# Patient Record
Sex: Female | Born: 1970 | Race: White | Hispanic: No | Marital: Married | Smoking: Never smoker
Health system: Southern US, Community
[De-identification: ages and names within clinical notes are randomized; demographics above are authoritative.]

## PROBLEM LIST (undated history)

## (undated) DIAGNOSIS — N200 Calculus of kidney: Secondary | ICD-10-CM

## (undated) HISTORY — PX: DILATION AND CURETTAGE OF UTERUS: SHX78

## (undated) HISTORY — DX: Calculus of kidney: N20.0

---

## 1999-11-08 ENCOUNTER — Inpatient Hospital Stay (HOSPITAL_COMMUNITY): Admission: AD | Admit: 1999-11-08 | Discharge: 1999-11-10 | Payer: Self-pay | Admitting: Gynecology

## 1999-12-20 ENCOUNTER — Other Ambulatory Visit: Admission: RE | Admit: 1999-12-20 | Discharge: 1999-12-20 | Payer: Self-pay | Admitting: Gynecology

## 2002-04-30 ENCOUNTER — Other Ambulatory Visit: Admission: RE | Admit: 2002-04-30 | Discharge: 2002-04-30 | Payer: Self-pay | Admitting: Gynecology

## 2002-07-31 ENCOUNTER — Inpatient Hospital Stay (HOSPITAL_COMMUNITY): Admission: AD | Admit: 2002-07-31 | Discharge: 2002-07-31 | Payer: Self-pay | Admitting: Obstetrics and Gynecology

## 2002-11-23 ENCOUNTER — Inpatient Hospital Stay (HOSPITAL_COMMUNITY): Admission: AD | Admit: 2002-11-23 | Discharge: 2002-11-25 | Payer: Self-pay | Admitting: Gynecology

## 2002-11-23 ENCOUNTER — Encounter (INDEPENDENT_AMBULATORY_CARE_PROVIDER_SITE_OTHER): Payer: Self-pay | Admitting: Specialist

## 2002-11-23 ENCOUNTER — Inpatient Hospital Stay (HOSPITAL_COMMUNITY): Admission: AD | Admit: 2002-11-23 | Discharge: 2002-11-23 | Payer: Self-pay | Admitting: Gynecology

## 2002-12-30 ENCOUNTER — Other Ambulatory Visit: Admission: RE | Admit: 2002-12-30 | Discharge: 2002-12-30 | Payer: Self-pay | Admitting: Gynecology

## 2006-10-04 ENCOUNTER — Ambulatory Visit: Payer: Self-pay

## 2006-10-04 ENCOUNTER — Observation Stay: Payer: Self-pay

## 2006-10-10 ENCOUNTER — Ambulatory Visit: Payer: Self-pay

## 2007-05-30 ENCOUNTER — Inpatient Hospital Stay (HOSPITAL_COMMUNITY): Admission: AD | Admit: 2007-05-30 | Discharge: 2007-05-30 | Payer: Self-pay | Admitting: Obstetrics and Gynecology

## 2007-10-26 ENCOUNTER — Encounter: Admission: RE | Admit: 2007-10-26 | Discharge: 2007-10-26 | Payer: Self-pay | Admitting: Obstetrics and Gynecology

## 2007-12-19 HISTORY — PX: TUBAL LIGATION: SHX77

## 2008-01-01 ENCOUNTER — Inpatient Hospital Stay (HOSPITAL_COMMUNITY): Admission: AD | Admit: 2008-01-01 | Discharge: 2008-01-04 | Payer: Self-pay | Admitting: Obstetrics and Gynecology

## 2008-08-19 HISTORY — PX: OTHER SURGICAL HISTORY: SHX169

## 2011-02-01 NOTE — Op Note (Signed)
[name], Natasha Hartman              ACCOUNT NO.:  [id number]   MEDICAL RECORD NO.:  [id number]           PATIENT TYPE:   LOCATION:                                 FACILITY:   PHYSICIAN:  Duke Salvia. Marcelle Overlie, M.Natasha.DATE OF BIRTH:  1971/08/16   DATE OF PROCEDURE:  01/02/2008  DATE OF DISCHARGE:                               OPERATIVE REPORT   PREOPERATIVE DIAGNOSIS:  Requests permanent sterilization.   POSTOPERATIVE DIAGNOSIS:  Requests permanent sterilization.   PROCEDURE:  Postpartum tubal ligation by Filshie clip application.   SURGEON:  Duke Salvia. Marcelle Overlie, M.Natasha.   ANESTHESIA:  Epidural.   COMPLICATIONS:  None.   DRAINS:  Foley catheter.   BLOOD LOSS:  Minimal.   INDICATIONS:  A 40 year old G3, now P3 status post vaginal delivery  presents for permanent sterilization.  This procedure including risks  related to bleeding, infection, other complications that may require  additional surgery, the permanence of the procedure and failure rate of  2-3 per 1000 all reviewed with her.  She remains firm in her decision.   PROCEDURE AND FINDINGS:  The patient was taken to the operating room.  After an adequate level of epidural anesthesia was obtained, the  periumbilical area was prepped and draped.  A small subumbilical  incision was made, carried down individually through fascia and  peritoneum, [employer] retractors were positioned and used to identify  the right tube which was grasped with a Babcock clamp, traced to the  fimbriated end.  The tube was then grasped 3-4 cm from the cornu between  2 Babcock clamps and a Filshie clip applied at a right angle completely  engulfing the tube with excellent application.  This area was  hemostatic.  On the left side the tube was traced all the way to the  fimbriated end, was regrasped 3-4 cm from the cornu between 2 Babcock  clamps and a Filshie clip was applied at right angles again, with  excellent application.  Prior to closure, sponge,  needle and instrument  counts were reported as correct x2.  The fascia was closed with a  running 2-0 Vicryl suture.  A 4-0 Vicryl subcuticular used on the skin  and sterile dressing applied.  She went to recovery room in good  condition.      Richard M. Marcelle Overlie, M.Natasha.  Electronically Signed    RMH/MEDQ  Natasha:  01/02/2008  T:  01/02/2008  Job:  [id number]

## 2011-02-04 NOTE — Discharge Summary (Signed)
   NAME:  Natasha Hartman, Natasha Hartman                     ACCOUNT NO.:  [id number]   MEDICAL RECORD NO.:  [id number]                   PATIENT TYPE:  INP   LOCATION:  9121                                 FACILITY:  [facility]   PHYSICIAN:  Juan H. Lily Peer, M.D.             DATE OF BIRTH:  1971-01-31   DATE OF ADMISSION:  11/23/2002  DATE OF DISCHARGE:  11/25/2002                                 DISCHARGE SUMMARY   DISCHARGE DIAGNOSES:  1. Intrauterine pregnancy 39 weeks, delivered.  2. Rh negative.  3. Vacuum assisted vaginal delivery.   HISTORY:  This is a 40 year old female gravida 2, para 1 with an EDC of  11/25/02.  Prenatal course was uncomplicated other than having an early  placenta previa which resolved.  She also was Rh negative and received  RhoGAM at 28 weeks.   HOSPITAL COURSE:  On 11/23/02 patient presented at 39+ weeks in labor.  Labor  was augmented with Pitocin. Due to the fact there was minimal fluid she had  artificial rupture of membranes.  The patient was started on amnioinfusion.  It was noted there were failed decelerations therefore patient underwent a  vacuum assisted vaginal delivery of a female, Apgar's of 8 and 9, weight of 7  pounds and one ounce on 11/23/02.  There was a midline episiotomy which was  repaired.  There were two small labial tears superficially which did not  require suturing.  Noted to be tight nuchal cord which was surgically  reduced.  It was noted that the female infant had evidence of hypospadias.  Postpartum the patient remained afebrile and was in stable condition.  She  was discharged home on 11/25/02 and given precautions and instructions,  postpartum booklet.   LABORATORY DATA:  The patient is O negative.  Rubella immune.  On 11/24/02  hemoglobin was 13.4.   DISPOSITION:  The patient is discharged home. Follow up in the office in six  weeks.  To be seen in the office for any prior problems.  She was given  RhoGAM on 11/24/02.     Susa Loffler,  P.A.                    Juan H. Lily Peer, M.D.    TSG/MEDQ  D:  01/17/2003  T:  01/17/2003  Job:  [id number]

## 2011-02-04 NOTE — H&P (Signed)
NAME:  Natasha Hartman, Natasha Hartman                     ACCOUNT NO.:  [id number]   MEDICAL RECORD NO.:  [id number]                   PATIENT TYPE:  INP   LOCATION:  9167                                 FACILITY:  [facility]   PHYSICIAN:  Juan H. Lily Peer, M.D.             DATE OF BIRTH:  26-Dec-1970   DATE OF ADMISSION:  11/23/2002  DATE OF DISCHARGE:                                HISTORY & PHYSICAL   CHIEF COMPLAINT:  Contractions.   HISTORY OF PRESENT ILLNESS:  The patient is a 40 year old gravida 2, para 1,  last menstrual period March 18, 2002, estimated date of confinement November 25, 2002.  Size and dates are in concurrence with early ultrasound.  The patient  started complaining of contractions at approximately 0430 hours this  morning. She presented to [facility] early this morning and was placed  on the monitor.  She had very mild infrequent contractions and her cervix  was 1 cm, 50%, and ballotable and after reassuring heart rate tracing with  stable vital signs, she was released home.  She later returned this  afternoon complaining of increasing frequency and intensity of her  contractions. She was found to be contracting every five minutes apart.  Her  cervix had changed to 4 cm, 50% effaced, and -3 station with reassuring  fetal heart rate tracing and stable vital signs.  She was admitted whereby  she underwent attempt at rupture of membranes and no fluid was obtained.  Scalp electrode and IUPC were placed.  Fetal heart rate tracing continued to  be reassuring and her GBS titers were negative.  She had benign prenatal  course.  She was treated for moniliasis during her pregnancy and early on  had a placenta previa that had resolved on follow-up ultrasound. She also  had received her RhoGAM at [redacted] weeks gestation.   PAST MEDICAL HISTORY:  She had a normal spontaneous vaginal delivery at [redacted]  weeks gestation in the year 2001 of a 7 pound 13 ounce female.  She  otherwise has a benign  past medical history.   REVIEW OF SYSTEMS:  See [place] form.   PHYSICAL EXAMINATION:  VITAL SIGNS: Blood pressure 130/77, pulse 89,  respirations 20, temperature 98.1.  HEENT:  Unremarkable.  NECK:  Supple. Trachea midline.  No carotid bruits. No thyromegaly.  LUNGS:  Clear to auscultation without rhonchi or wheezes.  HEART:  Regular rate and rhythm without murmurs, rubs, or gallops.  BREASTS: Not done.  ABDOMEN:  Gravid uterus. Vertex presentation by [facility] maneuver.  Positive  fetal heart tones.  PELVIC:  Cervix 4 cm dilated, 50% effaced, and -3 station.  EXTREMITIES:  DTR 1+, negative clonus, trace edema.   PRENATAL LABORATORY DATA:  O negative blood, negative antibody screen, VDRL  was nonreactive, rubella immune. Hepatitis B surface antigen and HIV were  negative.  Maternal serum alpha fetoprotein was normal.  Diabetes screen was  normal.  GBS cultures  were negative.   ASSESSMENT:  A 40 year old gravida 2, para 1, at 39-1/[redacted] weeks gestation is  admitted in labor and advanced cervical dilatation.  On monitoring in labor  and delivery, her contractions had spaced out. She will be augmented with  Pitocin at high dose per protocol and she wishes to have an epidural.  Reassuring fetal heart rate tracing, will  continue to monitor closely and hopefully anticipate a vaginal delivery.  As  noted her Group B Strep culture was negative.   PLAN:  As per assessment above.                                               Juan H. Lily Peer, M.D.    JHF/MEDQ  D:  11/23/2002  T:  11/23/2002  Job:  [id number]

## 2011-06-14 LAB — CBC
MCHC: 34.6
Platelets: 195
Platelets: 156
RBC: 4.16
RDW: 15.1
WBC: 11.4 — ABNORMAL HIGH

## 2011-06-14 LAB — RH IMMUNE GLOB WKUP(>/=20WKS)(NOT WOMEN'S HOSP): Fetal Screen: NEGATIVE

## 2011-06-14 LAB — RPR: RPR Ser Ql: NONREACTIVE

## 2011-07-01 LAB — RH IMMUNE GLOBULIN WORKUP (NOT WOMEN'S HOSP): Antibody Screen: NEGATIVE

## 2014-12-10 ENCOUNTER — Ambulatory Visit: Payer: Self-pay | Admitting: Family Medicine

## 2014-12-11 ENCOUNTER — Emergency Department: Payer: Self-pay | Admitting: Emergency Medicine

## 2014-12-11 LAB — COMPREHENSIVE METABOLIC PANEL
ALK PHOS: 75 U/L
Albumin: 3.7 g/dL
Anion Gap: 9 (ref 7–16)
BILIRUBIN TOTAL: 0.4 mg/dL
BUN: 28 mg/dL — AB
CO2: 25 mmol/L
Calcium, Total: 9 mg/dL
Chloride: 104 mmol/L
Creatinine: 1.78 mg/dL — ABNORMAL HIGH
EGFR (African American): 40 — ABNORMAL LOW
EGFR (Non-African Amer.): 35 — ABNORMAL LOW
Glucose: 107 mg/dL — ABNORMAL HIGH
Potassium: 3 mmol/L — ABNORMAL LOW
SGOT(AST): 28 U/L
SGPT (ALT): 19 U/L
Sodium: 138 mmol/L
TOTAL PROTEIN: 7 g/dL

## 2014-12-11 LAB — CBC WITH DIFFERENTIAL/PLATELET
BASOS ABS: 0.1 10*3/uL (ref 0.0–0.1)
Basophil %: 0.8 %
EOS PCT: 0.4 %
Eosinophil #: 0 10*3/uL (ref 0.0–0.7)
HCT: 39.8 % (ref 35.0–47.0)
HGB: 13.6 g/dL (ref 12.0–16.0)
LYMPHS ABS: 1.8 10*3/uL (ref 1.0–3.6)
Lymphocyte %: 17.7 %
MCH: 29.3 pg (ref 26.0–34.0)
MCHC: 34.1 g/dL (ref 32.0–36.0)
MCV: 86 fL (ref 80–100)
Monocyte #: 0.7 x10 3/mm (ref 0.2–0.9)
Monocyte %: 6.5 %
Neutrophil #: 7.5 10*3/uL — ABNORMAL HIGH (ref 1.4–6.5)
Neutrophil %: 74.6 %
Platelet: 263 10*3/uL (ref 150–440)
RBC: 4.63 10*6/uL (ref 3.80–5.20)
RDW: 12.6 % (ref 11.5–14.5)
WBC: 10 10*3/uL (ref 3.6–11.0)

## 2014-12-11 LAB — URINALYSIS, COMPLETE
Bilirubin,UR: NEGATIVE
Glucose,UR: NEGATIVE mg/dL (ref 0–75)
KETONE: NEGATIVE
LEUKOCYTE ESTERASE: NEGATIVE
NITRITE: NEGATIVE
PH: 7 (ref 4.5–8.0)
Protein: 30
RBC,UR: <1 /HPF (ref 0–5)
Specific Gravity: 1.004 (ref 1.003–1.030)

## 2015-04-13 ENCOUNTER — Other Ambulatory Visit: Payer: Self-pay | Admitting: Obstetrics and Gynecology

## 2015-04-14 LAB — CYTOLOGY - PAP

## 2015-07-21 ENCOUNTER — Telehealth: Payer: Self-pay

## 2015-07-21 NOTE — Telephone Encounter (Signed)
Patient called regarding blood in the stool. Patient was given an appointment with Dr. Sherrie MustacheFisher this Friday 07/24/15 @ 2:15.  I triaged the patient. Per patient onset started two weeks ago. The first week patient saw blood in the toilet (bright red blood). This week is not as much as last week  But is still there every time patient has a bowel movement and is loose. Per patient has been having abdominal pain. Sometimes is every time she eats something with loose stool. Now patient states sees the blood in her stool constantly.   Per Patient has a family history with Diverticulitis ans mother had polyps and were removed.  Please advise if patient needs to be seen sooner.  Thanks,  -Heidemarie Goodnow

## 2015-07-21 NOTE — Telephone Encounter (Signed)
OK to come in on Friday, but if she has any fever, heavy bleeding, or significant abdominal pain she needs to go to the ER.

## 2015-07-21 NOTE — Telephone Encounter (Signed)
[id number] 

## 2015-07-24 ENCOUNTER — Ambulatory Visit: Payer: Self-pay | Admitting: Family Medicine

## 2015-07-24 NOTE — Telephone Encounter (Signed)
Patient was notified. Patient had scheduled an appt for 07/24/15, however she canceled the appt because she is feeling better now. Patient stated that she is going to call back and schedule another appointment for a referral to gastro for the recurring symptoms.

## 2016-04-19 ENCOUNTER — Other Ambulatory Visit: Payer: Self-pay | Admitting: Obstetrics and Gynecology

## 2016-04-19 DIAGNOSIS — R928 Other abnormal and inconclusive findings on diagnostic imaging of breast: Secondary | ICD-10-CM

## 2016-04-20 ENCOUNTER — Ambulatory Visit
Admission: RE | Admit: 2016-04-20 | Discharge: 2016-04-20 | Disposition: A | Discharge status: Home / Self Care | Payer: Self-pay | Source: Ambulatory Visit | Attending: Obstetrics and Gynecology | Admitting: Obstetrics and Gynecology

## 2016-04-20 DIAGNOSIS — R928 Other abnormal and inconclusive findings on diagnostic imaging of breast: Secondary | ICD-10-CM

## 2017-05-03 DIAGNOSIS — Z01419 Encounter for gynecological examination (general) (routine) without abnormal findings: Secondary | ICD-10-CM | POA: Diagnosis not present

## 2017-05-03 DIAGNOSIS — Z1231 Encounter for screening mammogram for malignant neoplasm of breast: Secondary | ICD-10-CM | POA: Diagnosis not present

## 2017-07-23 DIAGNOSIS — Z23 Encounter for immunization: Secondary | ICD-10-CM | POA: Diagnosis not present

## 2017-12-22 ENCOUNTER — Ambulatory Visit: Payer: 59 | Admitting: Family Medicine

## 2017-12-22 ENCOUNTER — Encounter: Payer: Self-pay | Admitting: Family Medicine

## 2017-12-22 VITALS — BP 100/72 | HR 90 | Temp 97.9°F | Resp 16 | Ht 62.0 in | Wt 132.0 lb

## 2017-12-22 DIAGNOSIS — K219 Gastro-esophageal reflux disease without esophagitis: Secondary | ICD-10-CM | POA: Insufficient documentation

## 2017-12-22 DIAGNOSIS — E876 Hypokalemia: Secondary | ICD-10-CM | POA: Insufficient documentation

## 2017-12-22 DIAGNOSIS — J0100 Acute maxillary sinusitis, unspecified: Secondary | ICD-10-CM | POA: Diagnosis not present

## 2017-12-22 DIAGNOSIS — Z8632 Personal history of gestational diabetes: Secondary | ICD-10-CM | POA: Insufficient documentation

## 2017-12-22 DIAGNOSIS — R42 Dizziness and giddiness: Secondary | ICD-10-CM | POA: Diagnosis not present

## 2017-12-22 DIAGNOSIS — R112 Nausea with vomiting, unspecified: Secondary | ICD-10-CM

## 2017-12-22 LAB — POCT URINALYSIS DIPSTICK
Glucose, UA: NEGATIVE
LEUKOCYTES UA: NEGATIVE
NITRITE UA: NEGATIVE
PH UA: 6 (ref 5.0–8.0)
UROBILINOGEN UA: 0.2 U/dL

## 2017-12-22 MED ORDER — MECLIZINE HCL 25 MG PO TABS: 25.0000 mg | ORAL_TABLET | Freq: Three times a day (TID) | ORAL | 1 refills | Status: DC | PRN

## 2017-12-22 MED ORDER — AMOXICILLIN 500 MG PO CAPS: 1000.0000 mg | ORAL_CAPSULE | Freq: Two times a day (BID) | ORAL | 0 refills | Status: AC

## 2017-12-22 MED ORDER — FLUCONAZOLE 150 MG PO TABS: 150.0000 mg | ORAL_TABLET | Freq: Once | ORAL | 0 refills | Status: DC

## 2017-12-22 NOTE — Progress Notes (Signed)
Patient: Natasha Hartman Female    DOB: 05/11/1971   47 y.o.   MRN: [id number] Visit Date: 12/22/2017  Today's Provider: Mila Merryonald Fisher, MD   Chief Complaint  Patient presents with  . Headache   Subjective:    Headache   This is a new problem. The current episode started yesterday. The problem has been gradually worsening. The pain is located in the frontal and retro-orbital region. Quality: pressure. Associated symptoms include blurred vision, dizziness, ear pain (throbbing pain and pressure in ears), eye pain (behind the eyes), photophobia, sinus pressure, a sore throat and vomiting. Pertinent negatives include no abdominal pain, coughing, eye redness, eye watering, fever, nausea, swollen glands or weakness. The symptoms are aggravated by bright light and food. She has tried NSAIDs for the symptoms. The treatment provided mild relief.   She states it is similar to previous episodes of vertigo although nausea and vomiting have been worse. Has taking meclizine in the past but it makes her tired. She did have some pain in left lower back overnight which is better today.    Allergies  Allergen Reactions  . Codeine     GI upset     Current Outpatient Medications:  .  cetirizine (ZYRTEC) 10 MG tablet, Take 10 mg by mouth daily., Disp: , Rfl:  .  JUNEL FE 1/20 1-20 MG-MCG tablet, TAKE ACTIVE PILLS ONLY, NO WEEK OFF FOR MENSES, Disp: , Rfl: 4  Review of Systems  Constitutional: Positive for appetite change. Negative for chills, fatigue and fever.  HENT: Positive for ear pain (throbbing pain and pressure in ears), sinus pressure, sinus pain and sore throat.   Eyes: Positive for blurred vision, photophobia and pain (behind the eyes). Negative for redness.  Respiratory: Negative for cough, chest tightness and shortness of breath.   Cardiovascular: Negative for chest pain and palpitations.  Gastrointestinal: Positive for vomiting. Negative for abdominal pain and nausea.    Neurological: Positive for dizziness and headaches. Negative for weakness.  Psychiatric/Behavioral: Positive for sleep disturbance.    Social History   Tobacco Use  . Smoking status: Never Smoker  . Smokeless tobacco: Never Used  Substance Use Topics  . Alcohol use: Never    Frequency: Never   Objective:   BP 100/72 (BP Location: Left Arm, Patient Position: Sitting, Cuff Size: Normal)   Pulse 90   Temp 97.9 F (36.6 C) (Oral)   Resp 16   Ht 5\' 2"  (1.575 m)   Wt 132 lb (59.9 kg)   SpO2 97% Comment: room air  BMI 24.14 kg/m  Vitals:   12/22/17 1119  BP: 100/72  Pulse: 90  Resp: 16  Temp: 97.9 F (36.6 C)  TempSrc: Oral  SpO2: 97%  Weight: 132 lb (59.9 kg)  Height: 5\' 2"  (1.575 m)     Physical Exam  General Appearance:    Alert, cooperative, no distress  HENT:   bilateral TM normal without fluid or infection, neck without nodes, throat normal without erythema or exudate, sinuses nontender and nasal mucosa congested.   Eyes:    PERRL, conjunctiva/corneas clear, EOM's intact       Lungs:     Clear to auscultation bilaterally, respirations unlabored  Heart:    Regular rate and rhythm  Abd::   No CVAT      Results for orders placed or performed in visit on 12/22/17  POCT Urinalysis Dipstick  Result Value Ref Range   Color, UA amber  Clarity, UA cloudy    Glucose, UA negative    Bilirubin, UA color not on strip    Ketones, UA small (15)    Spec Grav, UA >=1.030 (A) 1.010 - 1.025   Blood, UA Large (Hemolyzed)    pH, UA 6.0 5.0 - 8.0   Protein, UA 30 (+)    Urobilinogen, UA 0.2 0.2 or 1.0 E.U./dL   Nitrite, UA Negative    Leukocytes, UA Negative Negative   Appearance     Odor         Assessment & Plan:     1. Vertigo  - meclizine (ANTIVERT) 25 MG tablet; Take 1 tablet (25 mg total) by mouth 3 (three) times daily as needed for dizziness.  Dispense: 30 tablet; Refill: 1  2. Nausea and vomiting, intractability of vomiting not specified, unspecified  vomiting type Is keeping down fluids. Expect to improve with meclizine.  - POCT Urinalysis Dipstick  3. Acute maxillary sinusitis, recurrence not specified  - amoxicillin (AMOXIL) 500 MG capsule; Take 2 capsules (1,000 mg total) by mouth 2 (two) times daily for 7 days.  Dispense: 28 capsule; Refill: 0 - fluconazole (DIFLUCAN) 150 MG tablet; Take 1 tablet (150 mg total) by mouth once for 1 dose.  Dispense: 1 tablet; Refill: 0  Call if symptoms change or if not rapidly improving.           Mila Merry, MD  [facility]

## 2017-12-22 NOTE — Patient Instructions (Signed)

## 2017-12-29 ENCOUNTER — Telehealth: Payer: Self-pay | Admitting: Family Medicine

## 2017-12-29 DIAGNOSIS — J0100 Acute maxillary sinusitis, unspecified: Secondary | ICD-10-CM

## 2017-12-29 MED ORDER — FLUCONAZOLE 150 MG PO TABS: 150.0000 mg | ORAL_TABLET | Freq: Once | ORAL | 0 refills | Status: AC

## 2017-12-29 NOTE — Telephone Encounter (Signed)
Please review. Thanks!  

## 2017-12-29 NOTE — Telephone Encounter (Signed)
Patient said she needed another Diflucan since taking Amoxicillin.   She has already taken 1 but that didn't cure the yeast.  [facility] [address]

## 2018-05-08 DIAGNOSIS — Z6824 Body mass index (BMI) 24.0-24.9, adult: Secondary | ICD-10-CM | POA: Diagnosis not present

## 2018-05-08 DIAGNOSIS — Z01419 Encounter for gynecological examination (general) (routine) without abnormal findings: Secondary | ICD-10-CM | POA: Diagnosis not present

## 2018-05-08 DIAGNOSIS — Z1231 Encounter for screening mammogram for malignant neoplasm of breast: Secondary | ICD-10-CM | POA: Diagnosis not present

## 2018-05-10 ENCOUNTER — Other Ambulatory Visit: Payer: Self-pay | Admitting: Obstetrics and Gynecology

## 2018-05-10 DIAGNOSIS — R928 Other abnormal and inconclusive findings on diagnostic imaging of breast: Secondary | ICD-10-CM

## 2018-05-14 ENCOUNTER — Ambulatory Visit
Admission: RE | Admit: 2018-05-14 | Discharge: 2018-05-14 | Disposition: A | Discharge status: Home / Self Care | Payer: 59 | Source: Ambulatory Visit | Attending: Obstetrics and Gynecology | Admitting: Obstetrics and Gynecology

## 2018-05-14 ENCOUNTER — Other Ambulatory Visit: Payer: Self-pay | Admitting: Obstetrics and Gynecology

## 2018-05-14 DIAGNOSIS — R921 Mammographic calcification found on diagnostic imaging of breast: Secondary | ICD-10-CM

## 2018-05-14 DIAGNOSIS — R928 Other abnormal and inconclusive findings on diagnostic imaging of breast: Secondary | ICD-10-CM

## 2018-05-15 ENCOUNTER — Ambulatory Visit
Admission: RE | Admit: 2018-05-15 | Discharge: 2018-05-15 | Disposition: A | Discharge status: Home / Self Care | Payer: 59 | Source: Ambulatory Visit | Attending: Obstetrics and Gynecology | Admitting: Obstetrics and Gynecology

## 2018-05-15 DIAGNOSIS — R921 Mammographic calcification found on diagnostic imaging of breast: Secondary | ICD-10-CM

## 2018-05-15 DIAGNOSIS — N6011 Diffuse cystic mastopathy of right breast: Secondary | ICD-10-CM | POA: Diagnosis not present

## 2018-06-27 DIAGNOSIS — Z23 Encounter for immunization: Secondary | ICD-10-CM | POA: Diagnosis not present

## 2018-11-15 ENCOUNTER — Encounter: Payer: Self-pay | Admitting: Family Medicine

## 2018-11-15 ENCOUNTER — Ambulatory Visit: Payer: 59 | Admitting: Family Medicine

## 2018-11-15 VITALS — BP 138/78 | HR 85 | Temp 99.1°F | Resp 18 | Wt 137.0 lb

## 2018-11-15 DIAGNOSIS — J069 Acute upper respiratory infection, unspecified: Secondary | ICD-10-CM

## 2018-11-15 DIAGNOSIS — R05 Cough: Secondary | ICD-10-CM | POA: Diagnosis not present

## 2018-11-15 DIAGNOSIS — R059 Cough, unspecified: Secondary | ICD-10-CM

## 2018-11-15 MED ORDER — BUDESONIDE-FORMOTEROL FUMARATE 160-4.5 MCG/ACT IN AERO: 2.0000 | INHALATION_SPRAY | Freq: Two times a day (BID) | RESPIRATORY_TRACT | 3 refills | Status: DC

## 2018-11-15 MED ORDER — AZITHROMYCIN 250 MG PO TABS: ORAL_TABLET | ORAL | 0 refills | Status: DC

## 2018-11-15 NOTE — Progress Notes (Signed)
       Patient: Natasha Hartman Female    DOB: 1970-12-31   48 y.o.   MRN: [id number] Visit Date: 11/15/2018  Today's Provider: Mila Merry, MD   Chief Complaint  Patient presents with  . Cough    X 1 week   Subjective:     Cough  This is a new problem. Episode onset: 1 week ago. The problem has been gradually worsening. The cough is non-productive. Associated symptoms include chest pain, myalgias, rhinorrhea and shortness of breath. Pertinent negatives include no chills, fever or sore throat. Treatments tried: Sudafed and Ibuprofen. The treatment provided mild relief.  Patient states her chest hurts when she coughs.  Allergies  Allergen Reactions  . Codeine     GI upset     Current Outpatient Medications:  .  cetirizine (ZYRTEC) 10 MG tablet, Take 10 mg by mouth daily., Disp: , Rfl:  .  JUNEL FE 1/20 1-20 MG-MCG tablet, TAKE ACTIVE PILLS ONLY, NO WEEK OFF FOR MENSES, Disp: , Rfl: 4  Review of Systems  Constitutional: Positive for fatigue. Negative for appetite change, chills, diaphoresis and fever.  HENT: Positive for congestion (chest congestion and nasal congestion) and rhinorrhea. Negative for sinus pressure, sinus pain, sneezing and sore throat.   Respiratory: Positive for cough and shortness of breath. Negative for chest tightness.   Cardiovascular: Positive for chest pain. Negative for palpitations.  Gastrointestinal: Negative for abdominal pain, nausea and vomiting.  Musculoskeletal: Positive for myalgias.  Neurological: Negative for dizziness and weakness.    Social History   Tobacco Use  . Smoking status: Never Smoker  . Smokeless tobacco: Never Used  Substance Use Topics  . Alcohol use: Never    Frequency: Never      Objective:   BP 138/78 (BP Location: Left Arm, Patient Position: Sitting, Cuff Size: Normal)   Pulse 85   Temp 99.1 F (37.3 C) (Oral)   Resp 18   Wt 137 lb (62.1 kg)   SpO2 97% Comment: room air  BMI 25.06 kg/m  Vitals:   11/15/18 0859  BP: 138/78  Pulse: 85  Resp: 18  Temp: 99.1 F (37.3 C)  TempSrc: Oral  SpO2: 97%  Weight: 137 lb (62.1 kg)     Physical Exam  General Appearance:    Alert, cooperative, no distress  HENT:   bilateral TM normal without fluid or infection, neck without nodes, pharynx erythematous without exudate and nasal mucosa pale and congested  Eyes:    PERRL, conjunctiva/corneas clear, EOM's intact       Lungs:     Occasional expiratory wheeze, no rales, , respirations unlabored  Heart:    Regular rate and rhythm  Neurologic:   Awake, alert, oriented x 3. No apparent focal neurological           defect.           Assessment & Plan    1. Upper respiratory tract infection, unspecified type  - azithromycin (ZITHROMAX) 250 MG tablet; Take one tablet twice today.  Then, take 1 tablet daily for 4 days.  Dispense: 6 tablet; Refill: 0  2. Cough  - budesonide-formoterol (SYMBICORT) 160-4.5 MCG/ACT inhaler; Inhale 2 puffs into the lungs 2 (two) times daily for 14 days.  Dispense: 1 Inhaler; Refill: 3  Call if symptoms change or if not rapidly improving.        Mila Merry, MD  [facility]

## 2018-11-15 NOTE — Patient Instructions (Signed)
Please review the attached list of medications and notify my office if there are any errors.   Please bring all of your medications to every appointment so we can make sure that our medication list is the same as yours.   You can take OTC Mucinex (guaifenesin) for chest or sinus congestion and to help your cough  

## 2018-11-25 DIAGNOSIS — J180 Bronchopneumonia, unspecified organism: Secondary | ICD-10-CM | POA: Diagnosis not present

## 2018-11-26 ENCOUNTER — Telehealth: Payer: Self-pay | Admitting: Family Medicine

## 2018-11-26 NOTE — Telephone Encounter (Signed)
Side and chest pain is probably pleurisy, which is inflammation around the lining of lung, usually from coughing. If this is improving then not prescription needed. If not then can send in prescription for 6 days prednisone taper.

## 2018-11-26 NOTE — Telephone Encounter (Signed)
Dr. [name], would you like me to call the patient? She was seen in the office 11/15/2018 for URI and cough. Patient was treated with z pack and Symbicort. Patient called today wanting to let you know  that she was seen in an urgent care since then and was told that she has some inflammation.  Per patient report, her chest x ray was clear and she was treated with 7 day course of Amoxicillin.

## 2018-11-26 NOTE — Telephone Encounter (Signed)
Spoke to pt and she wasn't really wanting to take prednisone.  So pt is going to give it a few days to see how she feels and if no better then she will call back for the pred taper to be called to pharmacy.  dbs

## 2018-11-26 NOTE — Telephone Encounter (Signed)
Letting Dr. [name] know:  Pt went to Urgent Care on Sat.  She was put back on Amoxicillin - 6 a day 500 mg per pill for 7 days. Did a chest Xray - looked clear.  Having side and chest pain was told it was inflammation at the Urgent Care.   Thanks, [employer]

## 2018-11-27 ENCOUNTER — Telehealth: Payer: Self-pay | Admitting: Family Medicine

## 2018-11-27 MED ORDER — PREDNISONE 10 MG PO TABS: ORAL_TABLET | ORAL | 0 refills | Status: AC

## 2018-11-27 NOTE — Telephone Encounter (Signed)
Have sent 6 day prednisone taper to her pharmacy. She start taking 6 the first day. Can take 2 tablet at a time spread out over the course of the day.

## 2018-11-27 NOTE — Telephone Encounter (Signed)
Pt advised.   Thanks,   -Gwendloyn Forsee  

## 2018-11-27 NOTE — Telephone Encounter (Signed)
Pt also stated she would like someone call her and explain how she needs to take the medication. She has never taken prednisone before and is concerned of things she has heard about it.

## 2018-11-27 NOTE — Telephone Encounter (Signed)
Pt called saying she was offered prednisone for pain and inflammation .  She declined the offer yesterday but has decided to take the medication now,  [facility] [address]  CB# [phone]

## 2018-11-28 ENCOUNTER — Telehealth: Payer: Self-pay

## 2018-11-28 NOTE — Telephone Encounter (Signed)
Pt advised.   Thanks,   -Laura  

## 2018-11-28 NOTE — Telephone Encounter (Signed)
Its safe to take, but only take 2 tablets a time, wait 3 or 4 hours before taking next dose.

## 2018-11-28 NOTE — Telephone Encounter (Signed)
Pt states she started her 6 day prednisone taper this morning.  Shortly after taking 3 of the 6 tablets, she noticed  numbness and tingling in her feet and toes.  She denies any other symptoms and feels well.  Pt wanted to know if it was safe to continue with the prednisone prescription.  Please advise.   Thanks,   -Vernona Rieger

## 2018-11-29 ENCOUNTER — Telehealth: Payer: Self-pay

## 2018-11-29 DIAGNOSIS — J0100 Acute maxillary sinusitis, unspecified: Secondary | ICD-10-CM

## 2018-11-29 MED ORDER — FLUCONAZOLE 150 MG PO TABS: 150.0000 mg | ORAL_TABLET | Freq: Once | ORAL | 0 refills | Status: AC

## 2018-11-29 NOTE — Telephone Encounter (Signed)
Tried calling patient. Left message to call back. 

## 2018-11-29 NOTE — Telephone Encounter (Signed)
Pt returned call ° °teri °

## 2018-11-29 NOTE — Telephone Encounter (Signed)
Its only a problem if she take diflucan every day. She only needs to take one dose so it wont cause a problem. She can take diflucan at any time. Have sent prescription to her pharmacy

## 2018-11-29 NOTE — Telephone Encounter (Signed)
Patient called office stating that she has walking pneumonia and pleurisy, she stats that she was prescribed Amoxicillin and Prednisone. Patient reports that antibiotic always causes her to have symptoms of a yeast infection. Patient states that she read that prednisone and Diflucan can cause interaction. Patient reports that she is concerned about interaction and wants to know what day would she be least susceptible to having a interaction with the two medications? KW

## 2018-11-29 NOTE — Telephone Encounter (Signed)
Patient advised and verbally voiced understanding.  

## 2018-12-03 ENCOUNTER — Encounter: Payer: Self-pay | Admitting: Family Medicine

## 2018-12-03 ENCOUNTER — Ambulatory Visit
Admission: RE | Admit: 2018-12-03 | Discharge: 2018-12-03 | Disposition: A | Discharge status: Home / Self Care | Payer: 59 | Source: Ambulatory Visit | Attending: Family Medicine | Admitting: Family Medicine

## 2018-12-03 ENCOUNTER — Other Ambulatory Visit: Payer: Self-pay

## 2018-12-03 ENCOUNTER — Ambulatory Visit: Payer: 59 | Admitting: Family Medicine

## 2018-12-03 ENCOUNTER — Telehealth: Payer: Self-pay | Admitting: Family Medicine

## 2018-12-03 VITALS — BP 120/80 | HR 85 | Temp 98.7°F | Resp 18 | Wt 138.0 lb

## 2018-12-03 DIAGNOSIS — R0781 Pleurodynia: Secondary | ICD-10-CM | POA: Insufficient documentation

## 2018-12-03 DIAGNOSIS — R12 Heartburn: Secondary | ICD-10-CM | POA: Diagnosis not present

## 2018-12-03 DIAGNOSIS — R1012 Left upper quadrant pain: Secondary | ICD-10-CM | POA: Diagnosis not present

## 2018-12-03 NOTE — Telephone Encounter (Signed)
Called saying she has Pleurisy and has been taking prednisone and is down to one pill.  She is still in pain and wants to know if she should come in or can she get a refill.  She has finished to Amoxil yesterday.   Please advise [phone]  [facility]  [address]  Thanks  Barth Kirks

## 2018-12-03 NOTE — Progress Notes (Signed)
Patient: Natasha Hartman Female    DOB: 1971/09/14   48 y.o.   MRN: [id number] Visit Date: 12/03/2018  Today's Provider: Mila Merry, MD   Chief Complaint  Patient presents with  . Follow-up  . Chest Pain   Subjective:     HPI  Left side chest pain: Patient was last seen in the office on 11/15/2018 for cough and URI. Patient was treated with Z pack and Symbicort and state cough resolved by the time she finished medications. However, a few days later she started having pain right flank and went to [facility] where she was prescribed 7 day course of Amoxicillin and told she had inflammation on her chest xray. Sx did not improved, she called here on 3/9 and prescribed a 6 day course of Prednisone which she states did not help at all. She states she still has a stabbing burning pain in the left side of her chest and upper abdomen that worsens when she breaths deeply and lies down. Chest pain worseness with movement and radiates to her back. Has had no fevers.  Also reports that she has burning in her mid chest. No symptoms radiated to arms or shoulders. Doesn't feel like eating, but eating does not have any affect on pain.    Allergies  Allergen Reactions  . Codeine     GI upset     Current Outpatient Medications:  .  cetirizine (ZYRTEC) 10 MG tablet, Take 10 mg by mouth daily., Disp: , Rfl:  .  JUNEL FE 1/20 1-20 MG-MCG tablet, TAKE ACTIVE PILLS ONLY, NO WEEK OFF FOR MENSES, Disp: , Rfl: 4 .  budesonide-formoterol (SYMBICORT) 160-4.5 MCG/ACT inhaler, Inhale 2 puffs into the lungs 2 (two) times daily for 14 days., Disp: 1 Inhaler, Rfl: 3 .  predniSONE (DELTASONE) 10 MG tablet, 6 tablets for 1 day, then 5 for 1 day, then 4 for 1 day, then 3 for 1 day, then 2 for 1 day then 1 for 1 day. (Patient not taking: Reported on 12/03/2018), Disp: 21 tablet, Rfl: 0  Review of Systems  Constitutional: Negative for appetite change, chills, fatigue and fever.  Respiratory: Positive for  shortness of breath. Negative for chest tightness.   Cardiovascular: Positive for chest pain (stabbing pain on left side of chest). Negative for palpitations.  Gastrointestinal: Negative for abdominal pain, nausea and vomiting.  Neurological: Negative for dizziness and weakness.    Social History   Tobacco Use  . Smoking status: Never Smoker  . Smokeless tobacco: Never Used  Substance Use Topics  . Alcohol use: Never    Frequency: Never      Objective:   BP 120/80 (BP Location: Left Arm, Patient Position: Sitting, Cuff Size: Normal)   Pulse 85   Temp 98.7 F (37.1 C) (Oral)   Resp 18   Wt 138 lb (62.6 kg)   SpO2 96% Comment: room air  BMI 25.24 kg/m  Vitals:   12/03/18 1556  BP: 120/80  Pulse: 85  Resp: 18  Temp: 98.7 F (37.1 C)  TempSrc: Oral  SpO2: 96%  Weight: 138 lb (62.6 kg)     Physical Exam   General Appearance:    Alert, cooperative, no distress  Eyes:    PERRL, conjunctiva/corneas clear, EOM's intact       Lungs:     Clear to auscultation bilaterally, respirations unlabored  Heart:    Regular rate and rhythm  dhest:   Diffusely tender RUQ and right  lower anterior, lateral and posterior rib cage. No masses or other deformities. Normal bowel sounds.          Assessment & Plan    1. Rib pain on left side  - DG Ribs Unilateral Left; Future  2. Left upper quadrant pain  - US Abdomen Limited; Future  Original URI sx have completely resolved. Current sx may have no relation to initial sx.   3. Heart burn Try OTC PPI     Mila Merry, MD  [facility]

## 2018-12-03 NOTE — Patient Instructions (Signed)
Go to the [facility] on [address] for left rib Xrays   Start taking OTC omeprazole (Prilosec) once a day for the next 2 weeks for heart burn.

## 2018-12-03 NOTE — Telephone Encounter (Signed)
Did she improve when she first started the prednisone? If so then we get her another round. If she didn't have any improvement with the prednisone then need to come in for further evaluation.

## 2018-12-03 NOTE — Telephone Encounter (Signed)
Patient has appt scheduled today.

## 2018-12-05 ENCOUNTER — Telehealth: Payer: Self-pay

## 2018-12-05 ENCOUNTER — Telehealth: Payer: Self-pay | Admitting: Family Medicine

## 2018-12-05 NOTE — Telephone Encounter (Signed)
Tobi Bastos advised.   Thanks,   -Vernona Rieger

## 2018-12-05 NOTE — Telephone Encounter (Signed)
Parker from [facility] [place] called needing clarification on the ordered ultrasound. She states patient is scheduled to come in tomorrow morning at 8am for a Limited abdominal ultrasound. Jimmey Ralph needs to know what organ Dr. [name] wants to look at. The associated dx  is LUQ pain. Should she be looking at the spleen, a nodule, or a mass? Is the pain deep for superficial? Jimmey Ralph needs someone to clarify this before patient's appointment tomorrow. Please advise.

## 2018-12-05 NOTE — Telephone Encounter (Signed)
Yes, I mainly want to get a good look at her spleen.

## 2018-12-05 NOTE — Telephone Encounter (Signed)
After routing this message to Dr. [name], I noticed that a message had already been sent to him asking the same questions. I called Parker back and advised her of Dr. Theodis Aguas response to that message.

## 2018-12-05 NOTE — Telephone Encounter (Signed)
Natasha Hartman needs to verify what you were looking for with pt's U/S.  She says generally the  Limited LUQ looks at the spleen.  She wanted to know if that was what you were looking at or was there something else they needed to focus on.    Thanks,   -Vernona Rieger

## 2018-12-05 NOTE — Telephone Encounter (Signed)
Tobi Bastos w/ [facility]  [phone]  Has a question on the order for tomorrow.  Please advise.  Thanks, [employer]

## 2018-12-06 ENCOUNTER — Ambulatory Visit
Admission: RE | Admit: 2018-12-06 | Discharge: 2018-12-06 | Disposition: A | Discharge status: Home / Self Care | Payer: 59 | Source: Ambulatory Visit | Attending: Family Medicine | Admitting: Family Medicine

## 2018-12-06 ENCOUNTER — Ambulatory Visit: Admission: RE | Admit: 2018-12-06 | Payer: 59 | Source: Ambulatory Visit

## 2018-12-06 ENCOUNTER — Other Ambulatory Visit: Payer: Self-pay

## 2018-12-06 DIAGNOSIS — R1012 Left upper quadrant pain: Secondary | ICD-10-CM | POA: Diagnosis not present

## 2018-12-07 ENCOUNTER — Other Ambulatory Visit: Payer: Self-pay

## 2018-12-07 ENCOUNTER — Ambulatory Visit (INDEPENDENT_AMBULATORY_CARE_PROVIDER_SITE_OTHER): Payer: 59 | Admitting: Family Medicine

## 2018-12-07 ENCOUNTER — Encounter: Payer: Self-pay | Admitting: Family Medicine

## 2018-12-07 ENCOUNTER — Telehealth: Payer: Self-pay | Admitting: Family Medicine

## 2018-12-07 VITALS — BP 126/74 | HR 112 | Temp 99.0°F | Resp 16 | Wt 138.0 lb

## 2018-12-07 DIAGNOSIS — R5383 Other fatigue: Secondary | ICD-10-CM

## 2018-12-07 DIAGNOSIS — M549 Dorsalgia, unspecified: Secondary | ICD-10-CM | POA: Diagnosis not present

## 2018-12-07 DIAGNOSIS — R1084 Generalized abdominal pain: Secondary | ICD-10-CM

## 2018-12-07 DIAGNOSIS — R1900 Intra-abdominal and pelvic swelling, mass and lump, unspecified site: Secondary | ICD-10-CM

## 2018-12-07 DIAGNOSIS — R319 Hematuria, unspecified: Secondary | ICD-10-CM | POA: Diagnosis not present

## 2018-12-07 DIAGNOSIS — R1010 Upper abdominal pain, unspecified: Secondary | ICD-10-CM

## 2018-12-07 LAB — POCT URINALYSIS DIPSTICK
Bilirubin, UA: NEGATIVE
Glucose, UA: NEGATIVE
Leukocytes, UA: NEGATIVE
Nitrite, UA: NEGATIVE
Protein, UA: NEGATIVE
SPEC GRAV UA: 1.02 (ref 1.010–1.025)
Urobilinogen, UA: 0.2 E.U./dL
pH, UA: 6 (ref 5.0–8.0)

## 2018-12-07 LAB — POCT URINE PREGNANCY: Preg Test, Ur: NEGATIVE

## 2018-12-07 MED ORDER — CEPHALEXIN 500 MG PO CAPS: 500.0000 mg | ORAL_CAPSULE | Freq: Three times a day (TID) | ORAL | 0 refills | Status: AC

## 2018-12-07 NOTE — Telephone Encounter (Signed)
Please see result note 

## 2018-12-07 NOTE — Telephone Encounter (Signed)
Pt called very stressed out about the pain she has been having for three weeks.  She is very upset that this has been going on for three weeks and she is getting no answers.    She would like someone to please call her back.  She said she does not want to the ER but feels like she may have to unless she can find out what is going on with her.  Please advise.  [phone]  Thanks Barth Kirks

## 2018-12-07 NOTE — Progress Notes (Addendum)
Patient: Natasha Hartman Female    DOB: [birth year]   48 y.o.   MRN: [id number] Visit Date: 12/07/2018  Today's Provider: Mila Merry, MD   Chief Complaint  Patient presents with  . Back Pain   Subjective:     HPI Patient comes in today c/o back pain that radiates to her left side. She reports that she has had pain for at least 2 weeks. Pain started as left sided chest pain after finishing antibiotic for for cough and URI the last week of February. She had Xray at Urgent Care at told she had inflammation. She was subsequently treated for prednisone but did not improved at all. She followed up here 3/16 and pain had spread to left upper flank. She had normal xrays of left ribs and  Ultrasound was completed yesterday which was normal. Since then shreports that she has severe fatigue, and has a bloated sensation in her left abdomen. Pain is now worse left lower back. Denies any dysuria or bowel changes, is nauseated, but no vomiting.   Allergies  Allergen Reactions  . Codeine     GI upset     Current Outpatient Medications:  .  cetirizine (ZYRTEC) 10 MG tablet, Take 10 mg by mouth daily., Disp: , Rfl:  .  JUNEL FE 1/20 1-20 MG-MCG tablet, TAKE ACTIVE PILLS ONLY, NO WEEK OFF FOR MENSES, Disp: , Rfl: 4 .  budesonide-formoterol (SYMBICORT) 160-4.5 MCG/ACT inhaler, Inhale 2 puffs into the lungs 2 (two) times daily for 14 days., Disp: 1 Inhaler, Rfl: 3  Review of Systems  Constitutional: Positive for activity change, appetite change and fatigue. Negative for chills, diaphoresis, fever and unexpected weight change.  Cardiovascular: Negative for chest pain, palpitations and leg swelling.  Gastrointestinal: Positive for abdominal distention, abdominal pain and nausea. Negative for blood in stool, constipation, diarrhea, rectal pain and vomiting.  Genitourinary: Positive for flank pain.  Musculoskeletal: Positive for back pain and myalgias.  Neurological: Positive for weakness,  light-headedness and headaches.  Psychiatric/Behavioral: Positive for agitation. The patient is nervous/anxious.     Social History   Tobacco Use  . Smoking status: Never Smoker  . Smokeless tobacco: Never Used  Substance Use Topics  . Alcohol use: Never    Frequency: Never      Objective:   BP 126/74 (BP Location: Right Arm, Patient Position: Sitting, Cuff Size: Normal)   Pulse (!) 112   Temp 99 F (37.2 C)   Resp 16   Wt 138 lb (62.6 kg)   SpO2 98%   BMI 25.24 kg/m  Vitals:   12/07/18 1556  BP: 126/74  Pulse: (!) 112  Resp: 16  Temp: 99 F (37.2 C)  SpO2: 98%  Weight: 138 lb (62.6 kg)     Physical Exam  General appearance: alert, well developed, well nourished, cooperative and in no distress Head: Normocephalic, without obvious abnormality, atraumatic Respiratory: Respirations even and unlabored, normal respiratory rate Extremities: No gross deformities Skin: Skin color, texture, turgor normal. No rashes seen  Psych: Appropriate mood and affect. Neurologic: Mental status: Alert, oriented to person, place, and time, thought content appropriate.  Results for orders placed or performed in visit on 12/22/17  POCT Urinalysis Dipstick  Result Value Ref Range   Color, UA amber    Clarity, UA cloudy    Glucose, UA negative    Bilirubin, UA color not on strip    Ketones, UA small (15)    Spec Grav, UA >=1.030 (  A) 1.010 - 1.025   Blood, UA Large (Hemolyzed)    pH, UA 6.0 5.0 - 8.0   Protein, UA 30 (+)    Urobilinogen, UA 0.2 0.2 or 1.0 E.U./dL   Nitrite, UA Negative    Leukocytes, UA Negative Negative   Appearance     Odor         Assessment & Plan    1. Other fatigue  - Comprehensive metabolic panel - CBC - TSH  2. Other acute back pain Migrating over the course of the last two weeks suspicious for renal calculus. Need to rule out pyelonephritis with urine culture which was collected and sent today. Cover with cephalexin while awaiting cultures.    3. Hematuria, unspecified type      Mila Merry, MD  [facility]  Addendum 12/12/2018 Urine culture are negative. Urine microscopy is negative Patient continues to have persistent left flank pain worsening over the last 3 weeks, now with fatigue, weakness and hematuria. Failed NSAIDs, prednisone and antibiotic. Normal spleen on ultrasound. Differential diagnosis includes renal calculi, renal cyst, and neoplasm. Need CT imaging for further evaluation.

## 2018-12-07 NOTE — Patient Instructions (Signed)
.   Please review the attached list of medications and notify my office if there are any errors.   . Please bring all of your medications to every appointment so we can make sure that our medication list is the same as yours.    You can take OTC ibuprofen, 3 tablet every six hours as needed for pain.

## 2018-12-07 NOTE — Telephone Encounter (Signed)
Pt coming in today for appt at 3:40pm

## 2018-12-07 NOTE — Telephone Encounter (Signed)
Dr. [name], could you review the patient's [place] before I call her? Any explanations to the pain she is having or recommendations? Please advise. Thanks!

## 2018-12-08 LAB — TSH: TSH: 0.479 u[IU]/mL (ref 0.450–4.500)

## 2018-12-08 LAB — CBC
HEMOGLOBIN: 14.1 g/dL (ref 11.1–15.9)
Hematocrit: 41.6 % (ref 34.0–46.6)
MCH: 29.9 pg (ref 26.6–33.0)
MCHC: 33.9 g/dL (ref 31.5–35.7)
MCV: 88 fL (ref 79–97)
Platelets: 361 10*3/uL (ref 150–450)
RBC: 4.72 x10E6/uL (ref 3.77–5.28)
RDW: 12.5 % (ref 11.7–15.4)
WBC: 8.8 10*3/uL (ref 3.4–10.8)

## 2018-12-08 LAB — COMPREHENSIVE METABOLIC PANEL
ALBUMIN: 4.3 g/dL (ref 3.8–4.8)
ALK PHOS: 68 IU/L (ref 39–117)
ALT: 13 IU/L (ref 0–32)
AST: 14 IU/L (ref 0–40)
Albumin/Globulin Ratio: 1.7 (ref 1.2–2.2)
BUN/Creatinine Ratio: 25 — ABNORMAL HIGH (ref 9–23)
BUN: 18 mg/dL (ref 6–24)
Bilirubin Total: 0.2 mg/dL (ref 0.0–1.2)
CO2: 23 mmol/L (ref 20–29)
CREATININE: 0.71 mg/dL (ref 0.57–1.00)
Calcium: 9.3 mg/dL (ref 8.7–10.2)
Chloride: 103 mmol/L (ref 96–106)
GFR calc Af Amer: 119 mL/min/{1.73_m2} (ref >59)
GFR calc non Af Amer: 103 mL/min/{1.73_m2} (ref >59)
GLUCOSE: 130 mg/dL — AB (ref 65–99)
Globulin, Total: 2.6 g/dL (ref 1.5–4.5)
Potassium: 4 mmol/L (ref 3.5–5.2)
Sodium: 140 mmol/L (ref 134–144)
TOTAL PROTEIN: 6.9 g/dL (ref 6.0–8.5)

## 2018-12-08 LAB — URINALYSIS, MICROSCOPIC ONLY
BACTERIA UA: NONE SEEN
CASTS: NONE SEEN /LPF

## 2018-12-09 LAB — URINE CULTURE

## 2018-12-10 ENCOUNTER — Telehealth: Payer: Self-pay

## 2018-12-10 ENCOUNTER — Telehealth: Payer: Self-pay | Admitting: Family Medicine

## 2018-12-10 DIAGNOSIS — R1084 Generalized abdominal pain: Secondary | ICD-10-CM

## 2018-12-10 NOTE — Telephone Encounter (Signed)
-----   Message from [name], MD sent at 12/10/2018 12:43 PM EDT ----- Urinalysis and culture were negative. Need to proceed with abdominal CT. Order has been entered, please advise patient.

## 2018-12-10 NOTE — Telephone Encounter (Signed)
-----   Message from [name], MD sent at 12/08/2018  8:12 AM EDT ----- Blood tests are all normal. Urine culture should be back by monday

## 2018-12-10 NOTE — Telephone Encounter (Signed)
Pt advised.  She agreed to proceed with the CT Scan.   Thanks,   -Jeannetta Cerutti  

## 2018-12-10 NOTE — Telephone Encounter (Signed)
Patient has been advised. KW 

## 2018-12-11 ENCOUNTER — Ambulatory Visit: Payer: 59

## 2018-12-11 ENCOUNTER — Telehealth: Payer: Self-pay

## 2018-12-11 NOTE — Telephone Encounter (Signed)
FYI: Patient called to report that she thinks that the current antibiotic that she is taking (Cephalexin) is helping to improve her symptoms. Today she feels like she has more energy and she is able to get up and do things. Patient is not 100% better, but she wanted you let you know that she feels a little better. She is still waiting on the CT to be scheduled.

## 2018-12-13 ENCOUNTER — Other Ambulatory Visit: Payer: Self-pay

## 2018-12-13 ENCOUNTER — Ambulatory Visit
Admission: RE | Admit: 2018-12-13 | Discharge: 2018-12-13 | Disposition: A | Discharge status: Home / Self Care | Payer: 59 | Source: Ambulatory Visit | Attending: Family Medicine | Admitting: Family Medicine

## 2018-12-13 DIAGNOSIS — R1084 Generalized abdominal pain: Secondary | ICD-10-CM | POA: Diagnosis not present

## 2018-12-13 MED ORDER — IOHEXOL 300 MG/ML  SOLN
100.0000 mL | Freq: Once | INTRAMUSCULAR | Status: AC | PRN
  Administered 2018-12-13: 100 mL via INTRAVENOUS

## 2018-12-14 ENCOUNTER — Telehealth: Payer: Self-pay

## 2018-12-14 ENCOUNTER — Telehealth: Payer: Self-pay | Admitting: *Deleted

## 2018-12-14 DIAGNOSIS — R1084 Generalized abdominal pain: Secondary | ICD-10-CM

## 2018-12-14 NOTE — Telephone Encounter (Signed)
I've reviewed chart. Dr. [name] evaluated this patient with urinalysis and culture and treated with Keflex until cultures returned to rule out kidney infection. Her urine culture is negative. He also got CT abdomen to r/o kidney stone - no stones found on imaging. I do not see that Dr. [name] assessed this as a kidney infection nor does her urine culture support that, he has placed a referral to urology. She should follow up with urology as Dr. [name] would like her to. She does not need another round of antibiotics. There is another phone message regarding this as well.

## 2018-12-14 NOTE — Telephone Encounter (Signed)
Patient is calling office stating that she is on her last day of Keflex, she states that she was seen recently in office by Dr. [name] for acute back pain. Patient states that she had this same problem several months ago and when seen in ER she states that she was diagnosed with a kidney infection. Patient states that when she was in office she told Dr. [name] that she believed that it was another kidney infection. Patient states that she is on last day of medication and her symptoms have not improved. Patient reports having pressure in pelvis and achy, patient also reports headache and nausea and is requesting that we send in another week worth of Keflex? Patient was advised that Dr. [name] is out of the office today and she is asking for PA to review chart and refill medication, patient uses [facility] on [address]. Please Advise. KW

## 2018-12-14 NOTE — Telephone Encounter (Signed)
Called patient and had extensive conversation with her about symptoms and findings. Have reviewed that her CT abdomen did not show any renal or ureteral stones. Her intestines looked normal as did reproductive organs. Her urine dipstick in office showed blood but microscopic UA did not show blood and I have explained that this can be due to lower accuracy of our in office testing. Have explained her urine culture is negative. She says she is completing the keflex currently and she had one day where she felt better and more energized but now feels poorly again. Feels her left side is sensitive and that it hurts to put on jeans. Denies fevers, vomiting. Really feels this is a kidney infection because she had similar symptoms 3 years ago and this is what the ER diagnosed her with. Advised again her urine culture is negative and there are no stones, no documented fever, CVA tenderness. I do not think another round of antibiotics will help her. Patient expresses frustration at going through multiple tests and not receiving answers. Offered support through this. Offered to schedule visit for upcoming week, patient says she will wait and attend urgent care if she feels worse.

## 2018-12-14 NOTE — Telephone Encounter (Signed)
I advised patient of message below per Adriana. Patient became very upset and emotional on phone stating that she feels that we are not trying to help her and push her to Urology. Patient states that [facility] is closed and states that she knows that she wont be able to see urology anytime soon. Patient states that she has been suffering for 3 weeks and is unable to get up from her couch. Patient states that she is unable to eat or drink and states that it feels that she is going to pass out, last week she states that she was very disoriented and now her urine "dosent appear to look right".Patient states that she has had a kidney infection before and knows that is what she is suffering with now. Patient states that she will not waste money going to ER or Urgent care. Patient request that Ricki Rodriguez give her a call back. KW

## 2018-12-14 NOTE — Telephone Encounter (Signed)
-----   Message from [name], MD sent at 12/13/2018 12:41 PM EDT ----- CT scan is normal. No sign of any kidney problems. If improving then finish antibiotic and repeat urinalysis in a week. If not improving then refer to urology for flank pain and hematuria.

## 2018-12-14 NOTE — Telephone Encounter (Signed)
Patient was notified of results. Expressed understanding. Referral in Epic. Patient is also requesting another round antibiotics. Please advise?

## 2018-12-14 NOTE — Telephone Encounter (Signed)
Please see other telephone message regarding this.

## 2018-12-18 ENCOUNTER — Ambulatory Visit: Payer: 59

## 2019-02-19 ENCOUNTER — Ambulatory Visit: Payer: 59 | Admitting: Urology

## 2019-06-26 ENCOUNTER — Ambulatory Visit: Payer: 59 | Admitting: Urology

## 2019-06-26 ENCOUNTER — Other Ambulatory Visit: Payer: Self-pay

## 2019-06-26 ENCOUNTER — Telehealth: Payer: Self-pay | Admitting: Urology

## 2019-06-26 ENCOUNTER — Encounter: Payer: Self-pay | Admitting: Urology

## 2019-06-26 DIAGNOSIS — Z87442 Personal history of urinary calculi: Secondary | ICD-10-CM

## 2019-06-26 DIAGNOSIS — R3915 Urgency of urination: Secondary | ICD-10-CM

## 2019-06-26 DIAGNOSIS — R109 Unspecified abdominal pain: Secondary | ICD-10-CM

## 2019-06-26 LAB — MICROSCOPIC EXAMINATION
Bacteria, UA: NONE SEEN
RBC, Urine: NONE SEEN /hpf (ref 0–2)

## 2019-06-26 LAB — URINALYSIS, COMPLETE
Bilirubin, UA: NEGATIVE
Glucose, UA: NEGATIVE
Leukocytes,UA: NEGATIVE
Nitrite, UA: NEGATIVE
Protein,UA: NEGATIVE
Specific Gravity, UA: 1.025 (ref 1.005–1.030)
Urobilinogen, Ur: 0.2 mg/dL (ref 0.2–1.0)
pH, UA: 5 (ref 5.0–7.5)

## 2019-06-26 MED ORDER — ONDANSETRON HCL 4 MG PO TABS: 4.0000 mg | ORAL_TABLET | Freq: Three times a day (TID) | ORAL | 0 refills | Status: DC | PRN

## 2019-06-26 MED ORDER — TAMSULOSIN HCL 0.4 MG PO CAPS: 0.4000 mg | ORAL_CAPSULE | Freq: Every day | ORAL | 0 refills | Status: DC

## 2019-06-26 MED ORDER — KETOROLAC TROMETHAMINE 10 MG PO TABS: 10.0000 mg | ORAL_TABLET | Freq: Four times a day (QID) | ORAL | 0 refills | Status: DC | PRN

## 2019-06-26 NOTE — Progress Notes (Signed)
06/26/2019 8:53 AM   Natasha Hartman Natasha Hartman [birth year] [id number]  Referring provider: [name], MD [address] [place],  [place] [address]  Chief Complaint  Patient presents with  . Nephrolithiasis    seen at [facility] walk in    HPI: 48 y.o. female who presents for evaluation of right flank pain and lower urinary tract symptoms.  She saw Dr. [name] on 12/07/2018 with a 2-day history of a left flank pain associated with nausea and abdominal bloating.  Urinalysis showed moderate blood on dipstick however microscopy was negative.  She was started empirically on cephalexin.  Urine culture was negative.  A CT of the abdomen pelvis was performed which showed no abnormalities.  There was a small 7 mm left upper pole cyst.  She states several days after her CT she passed a small stone that she saw on the toilet but was unable to retrieve.  Her symptoms resolved at that time.  She presented to [facility] on 06/19/2019 with a 3-day history of right flank pain and mild dysuria.  Urinalysis showed 4-10 RBCs with moderate epithelial cells and no pyuria.  She was started on a 7-day course of Cipro and cefdinir.  Urine culture was ordered which was subsequently negative growing mixed flora.  She presently complains of continued right flank pain radiating to the right lower back.  She has suprapubic pressure, urgency and voiding small amounts.  She has nausea without vomiting.  Denies fever or chills.  She was prescribed hydrocodone for her pain at acute care though states this causes tachycardia and she has been taking ibuprofen.  PMH: Past Medical History:  Diagnosis Date  . Kidney stone     Surgical History: Past Surgical History:  Procedure Laterality Date  . DILATION AND CURETTAGE OF UTERUS     x 2  . TUBAL LIGATION  12/2007  . uterine ablation  08/2008    Home Medications:  Allergies as of 06/26/2019      Reactions   Hydrocodone-acetaminophen Shortness Of  Breath   Codeine    GI upset      Medication List       Accurate as of June 26, 2019 11:59 PM. If you have any questions, ask your nurse or doctor.        budesonide-formoterol 160-4.5 MCG/ACT inhaler Commonly known as: SYMBICORT Inhale 2 puffs into the lungs 2 (two) times daily for 14 days.   cefdinir 300 MG capsule Commonly known as: OMNICEF Take by mouth.   cetirizine 10 MG tablet Commonly known as: ZYRTEC Take 10 mg by mouth daily.   ciprofloxacin 500 MG tablet Commonly known as: CIPRO Take by mouth.   ibuprofen 200 MG tablet Commonly known as: ADVIL Take 600 mg by mouth every 6 (six) hours as needed.   Junel FE 1/20 1-20 MG-MCG tablet Generic drug: norethindrone-ethinyl estradiol TAKE ACTIVE PILLS ONLY, NO WEEK OFF FOR MENSES   ketorolac 10 MG tablet Commonly known as: TORADOL Take 1 tablet (10 mg total) by mouth every 6 (six) hours as needed. Started by: [name], MD   naproxen 500 MG tablet Commonly known as: NAPROSYN Take by mouth.   ondansetron 4 MG tablet Commonly known as: ZOFRAN Take 1 tablet (4 mg total) by mouth every 8 (eight) hours as needed for nausea or vomiting. Started by: [name], MD   tamsulosin 0.4 MG Caps capsule Commonly known as: FLOMAX Take 1 capsule (0.4 mg total) by mouth  daily. Started by: [name], MD   UNABLE TO FIND COMPOUNDED MEDICATION  Boric Acid suppository 600mg   1 pv qhs x 7 days then qod x 1 week then twice weekly x 3 weeks       Allergies:  Allergies  Allergen Reactions  . Hydrocodone-Acetaminophen Shortness Of Breath  . Codeine     GI upset    Family History: Family History  Problem Relation Age of Onset  . Diabetes Father        type 2  . Heart disease Paternal Grandmother   . Hypertension Paternal Grandmother     Social History:  reports that she has never smoked. She has never used smokeless tobacco. She reports that she does not drink alcohol or use drugs.  ROS:  UROLOGY Frequent Urination?: Yes Hard to postpone urination?: Yes Burning/pain with urination?: No Get up at night to urinate?: Yes Leakage of urine?: No Urine stream starts and stops?: No Trouble starting stream?: No Do you have to strain to urinate?: No Blood in urine?: Yes Urinary tract infection?: No Sexually transmitted disease?: No Injury to kidneys or bladder?: No Painful intercourse?: No Weak stream?: No Currently pregnant?: No Vaginal bleeding?: No Last menstrual period?: Ablation  Gastrointestinal Nausea?: Yes Vomiting?: No Indigestion/heartburn?: No Diarrhea?: No Constipation?: No  Constitutional Fever: No Night sweats?: No Weight loss?: No Fatigue?: No  Skin Skin rash/lesions?: No Itching?: No  Eyes Blurred vision?: No Double vision?: No  Ears/Nose/Throat Sore throat?: No Sinus problems?: No  Hematologic/Lymphatic Swollen glands?: No Easy bruising?: No  Cardiovascular Leg swelling?: No Chest pain?: No  Respiratory Cough?: No Shortness of breath?: No  Endocrine Excessive thirst?: No  Musculoskeletal Back pain?: Yes Joint pain?: No  Neurological Headaches?: No Dizziness?: No  Psychologic Depression?: No Anxiety?: No  Physical Exam: BP 136/83 (BP Location: Left Arm, Patient Position: Sitting, Cuff Size: Normal)   Pulse (!) 111   Ht 5\' 2"  (1.575 m)   Wt 137 lb 14.4 oz (62.6 kg)   BMI 25.22 kg/m   Constitutional:  Alert and oriented, No acute distress. HEENT: [place] AT, moist mucus membranes.  Trachea midline, no masses. Cardiovascular: No clubbing, cyanosis, or edema. Respiratory: Normal respiratory effort, no increased work of breathing. GI: Abdomen is soft, nontender, nondistended, no abdominal masses GU: No CVA tenderness Lymph: No cervical or inguinal lymphadenopathy. Skin: No rashes, bruises or suspicious lesions. Neurologic: Grossly intact, no focal deficits, moving all 4 extremities. Psychiatric: Normal mood and  affect.  Laboratory Data:  Urinalysis Dipstick 2+ blood negative leukocytes Microscopy 5-10 WBC   Assessment & Plan:   Her recent urinalysis at [facility] showed moderate epithelial cells making this a contaminated specimen.  Her urinalysis have not been consistent with infection and would recommend avoiding empiric treatment unless she has a positive urine culture.  Urine culture today did show pyuria on microscopy and will repeat the culture.  Although her CT in March was unremarkable she states she did pass a stone several days later with resolution of her symptoms.  We discussed the chance of her forming a stone in 6 months which she would be unable to pass would be unlikely.  She could potentially have a small distal ureteral calculus and would recommend starting tamsulosin.  She has not tolerated hydrocodone and Rx ketorolac was sent to pharmacy.  She will instructed to discontinue the ibuprofen.  If she has worsening symptoms will order a stone protocol CT.  Follow-up in approximately 1 month unless symptoms worsen.  [name], [place] [address] [place], [place] [address] [phone]

## 2019-06-26 NOTE — Telephone Encounter (Signed)
Pt wants to know if she can have something called in for nausea, she forgot to ask while she was here in the office.

## 2019-06-26 NOTE — Telephone Encounter (Signed)
Notified patient of script sent to pharmacy. 

## 2019-06-27 ENCOUNTER — Encounter: Payer: Self-pay | Admitting: Urology

## 2019-06-30 LAB — CULTURE, URINE COMPREHENSIVE

## 2019-07-01 ENCOUNTER — Telehealth: Payer: Self-pay

## 2019-07-01 ENCOUNTER — Other Ambulatory Visit: Payer: Self-pay | Admitting: Urology

## 2019-07-01 MED ORDER — FLUCONAZOLE 100 MG PO TABS: 100.0000 mg | ORAL_TABLET | Freq: Every day | ORAL | 0 refills | Status: DC

## 2019-07-01 NOTE — Telephone Encounter (Signed)
-----   Message from [name], MD sent at 07/01/2019  9:56 AM EDT ----- Urine culture did grow yeast.  This may reflect overgrowth due to all of the antibiotic she has been on.  I am not sure if this is accounting for his symptoms but will go ahead and treat.  Rx fluconazole was sent to pharmacy.

## 2019-07-01 NOTE — Telephone Encounter (Signed)
Called pt informed her of the information below. Pt gave verbal understanding.  

## 2019-07-25 ENCOUNTER — Ambulatory Visit (INDEPENDENT_AMBULATORY_CARE_PROVIDER_SITE_OTHER): Payer: 59 | Admitting: Urology

## 2019-07-25 ENCOUNTER — Encounter: Payer: Self-pay | Admitting: Urology

## 2019-07-25 ENCOUNTER — Other Ambulatory Visit: Payer: Self-pay

## 2019-07-25 VITALS — BP 121/78 | HR 101 | Ht 62.0 in | Wt 137.0 lb

## 2019-07-25 DIAGNOSIS — Z87442 Personal history of urinary calculi: Secondary | ICD-10-CM

## 2019-07-25 NOTE — Progress Notes (Signed)
07/25/2019 3:10 PM   Natasha Hartman 05-12-1971 [id number]  Referring provider: No referring provider defined for this encounter.  Chief Complaint  Patient presents with  . Nephrolithiasis    HPI: 48 y.o. female seen 06/26/2019 with right flank pain and lower urinary tract symptoms.  It was thought she most likely had a distal ureteral calculus based on her symptoms and history.  She states she passed a stone last week but was unable to retrieve.  Her symptoms have resolved.   PMH: Past Medical History:  Diagnosis Date  . Kidney stone     Surgical History: Past Surgical History:  Procedure Laterality Date  . DILATION AND CURETTAGE OF UTERUS     x 2  . TUBAL LIGATION  12/2007  . uterine ablation  08/2008    Home Medications:  Allergies as of 07/25/2019      Reactions   Hydrocodone-acetaminophen Shortness Of Breath   Codeine    GI upset      Medication List       Accurate as of July 25, 2019  3:10 PM. If you have any questions, ask your nurse or doctor.        budesonide-formoterol 160-4.5 MCG/ACT inhaler Commonly known as: SYMBICORT Inhale 2 puffs into the lungs 2 (two) times daily for 14 days.   cetirizine 10 MG tablet Commonly known as: ZYRTEC Take 10 mg by mouth daily.   fluconazole 100 MG tablet Commonly known as: DIFLUCAN Take 1 tablet (100 mg total) by mouth daily. X 7 days   Junel FE 1/20 1-20 MG-MCG tablet Generic drug: norethindrone-ethinyl estradiol TAKE ACTIVE PILLS ONLY, NO WEEK OFF FOR MENSES   ketorolac 10 MG tablet Commonly known as: TORADOL Take 1 tablet (10 mg total) by mouth every 6 (six) hours as needed.   ondansetron 4 MG tablet Commonly known as: ZOFRAN Take 1 tablet (4 mg total) by mouth every 8 (eight) hours as needed for nausea or vomiting.   tamsulosin 0.4 MG Caps capsule Commonly known as: FLOMAX Take 1 capsule (0.4 mg total) by mouth daily.   UNABLE TO FIND COMPOUNDED MEDICATION  Boric Acid suppository  600mg   1 pv qhs x 7 days then qod x 1 week then twice weekly x 3 weeks       Allergies:  Allergies  Allergen Reactions  . Hydrocodone-Acetaminophen Shortness Of Breath  . Codeine     GI upset    Family History: Family History  Problem Relation Age of Onset  . Diabetes Father        type 2  . Heart disease Paternal Grandmother   . Hypertension Paternal Grandmother     Social History:  reports that she has never smoked. She has never used smokeless tobacco. She reports that she does not drink alcohol or use drugs.  ROS: UROLOGY Frequent Urination?: No Hard to postpone urination?: No Burning/pain with urination?: No Get up at night to urinate?: No Leakage of urine?: No Urine stream starts and stops?: No Trouble starting stream?: No Do you have to strain to urinate?: No Blood in urine?: No Urinary tract infection?: No Sexually transmitted disease?: No Injury to kidneys or bladder?: No Painful intercourse?: No Weak stream?: No Currently pregnant?: No Vaginal bleeding?: No Last menstrual period?: N/A  Gastrointestinal Nausea?: No Vomiting?: No Indigestion/heartburn?: No Diarrhea?: No Constipation?: No  Constitutional Fever: No Night sweats?: No Weight loss?: No Fatigue?: No  Skin Skin rash/lesions?: No Itching?: No  Eyes Blurred vision?: No Double vision?:  No  Ears/Nose/Throat Sore throat?: No Sinus problems?: No  Hematologic/Lymphatic Swollen glands?: No Easy bruising?: No  Cardiovascular Leg swelling?: No Chest pain?: No  Respiratory Cough?: No Shortness of breath?: No  Endocrine Excessive thirst?: No  Musculoskeletal Back pain?: No Joint pain?: No  Neurological Headaches?: No Dizziness?: No  Psychologic Depression?: No Anxiety?: No  Physical Exam: BP 121/78 (BP Location: Left Arm, Patient Position: Sitting, Cuff Size: Normal)   Pulse (!) 101   Ht 5\' 2"  (1.575 m)   Wt 137 lb (62.1 kg)   BMI 25.06 kg/m    Constitutional:  Alert and oriented, No acute distress. HEENT: [place] AT, moist mucus membranes.  Trachea midline, no masses. Cardiovascular: No clubbing, cyanosis, or edema. Respiratory: Normal respiratory effort, no increased work of breathing. Skin: No rashes, bruises or suspicious lesions. Neurologic: Grossly intact, no focal deficits, moving all 4 extremities. Psychiatric: Normal mood and affect.   Assessment & Plan:    - History urinary tract stones She has passed 2 stones since 12/30/18.  I discussed a metabolic evaluation and recommended.  We also discussed general stone prevention guidelines.  She would like to think over pursuing a metabolic evaluation but did request information on general stone prevention.  If she does not desire to pursue the metabolic evaluation recommend a 1 year follow-up with [facility].   April 2020, MD  [facility] [address], [address] [place], [place] [phone]

## 2019-07-27 ENCOUNTER — Encounter: Payer: Self-pay | Admitting: Urology

## 2019-10-13 IMAGING — US ULTRASOUND ABDOMEN LIMITED
1 series · 12 of 12 positions shown · non-contrast
Comparison: CT 12/11/2014.

CLINICAL DATA: Left upper quadrant pain.  Spleen evaluation.

EXAM:
ULTRASOUND ABDOMEN LIMITED

[Series 1: ultrasound abdomen limited · 0.20mm/px · 12 of 12 slices shown]
[im 1/12]
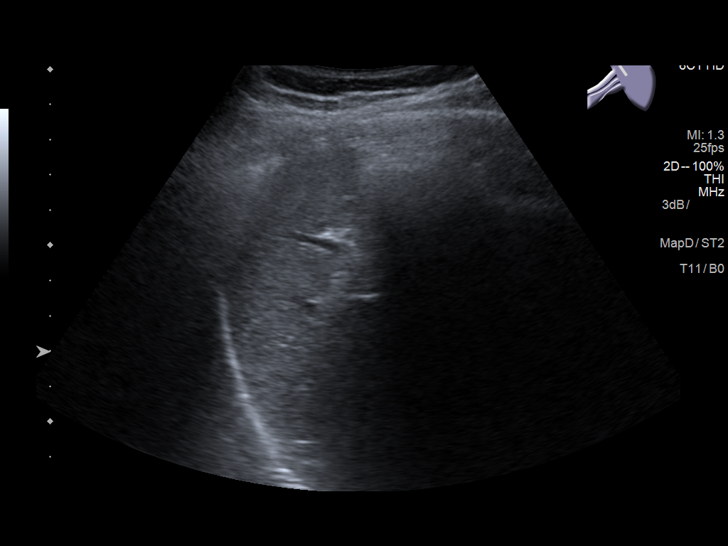
[im 2/12]
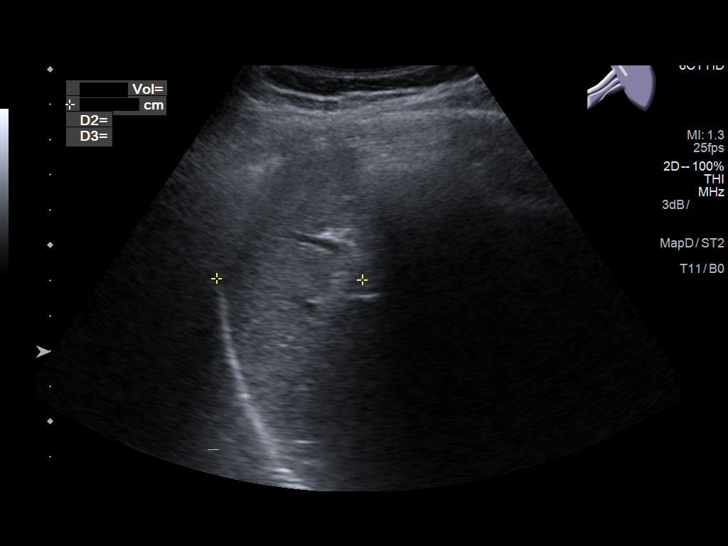
[im 3/12]
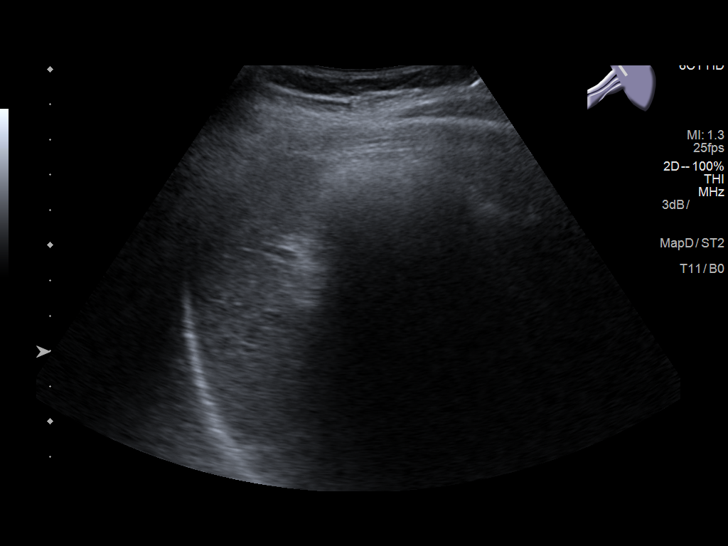
[im 4/12]
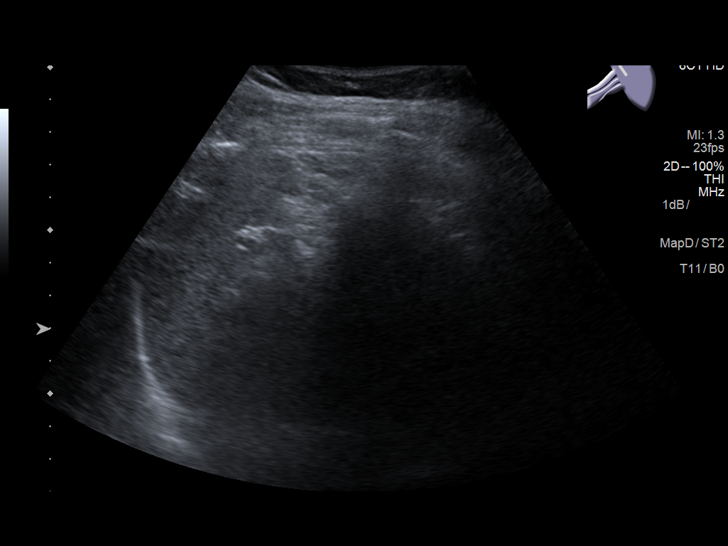
[im 5/12]
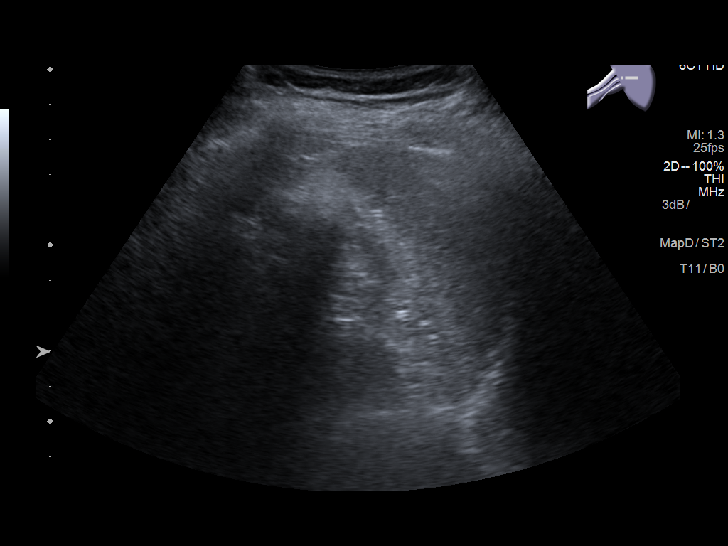
[im 6/12]
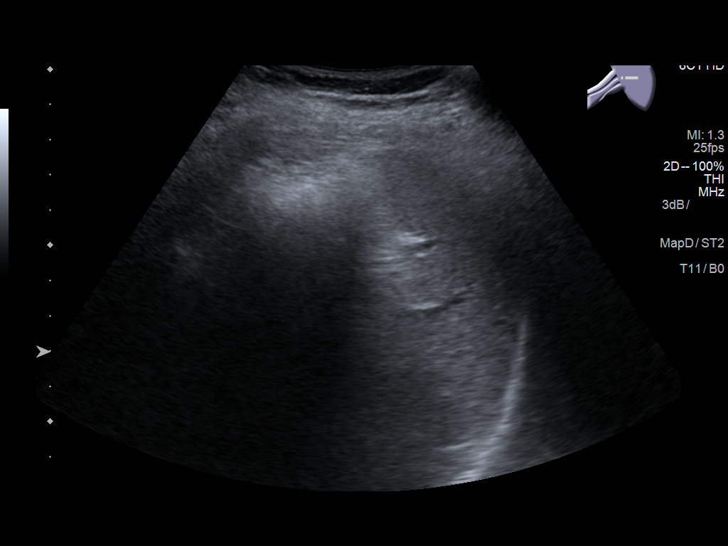
[im 7/12]
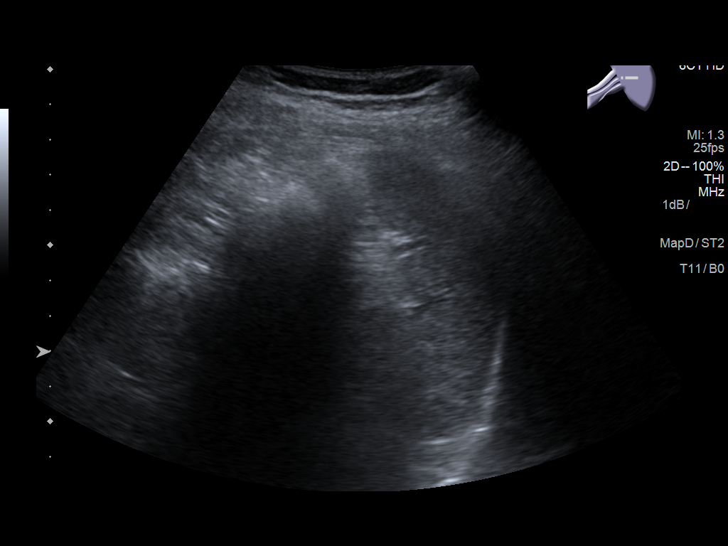
[im 8/12]
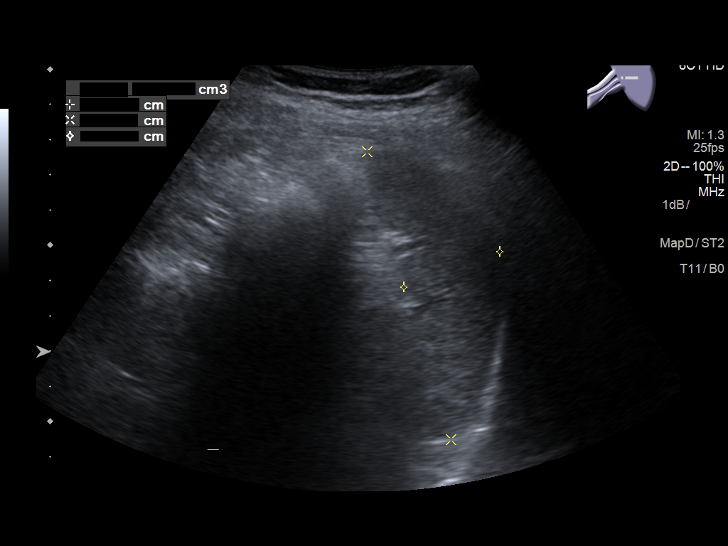
[im 9/12]
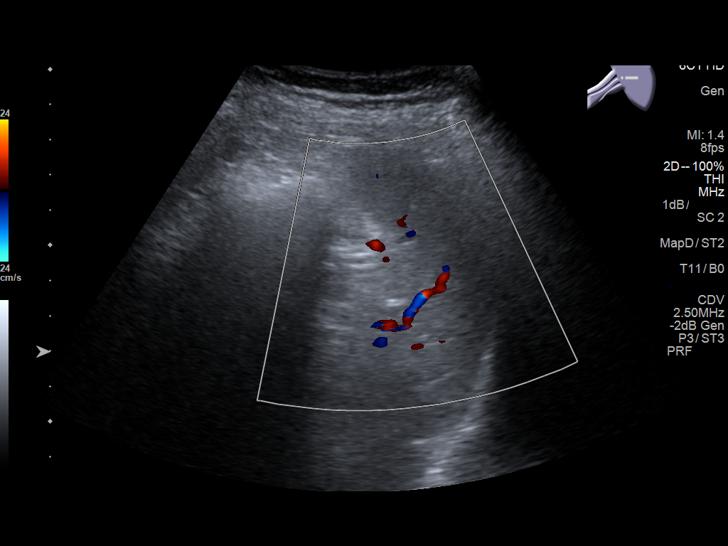
[im 10/12]
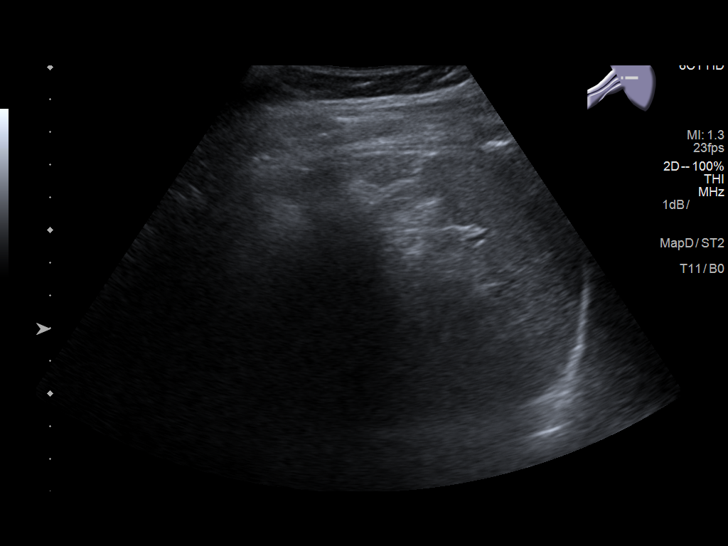
[im 11/12]
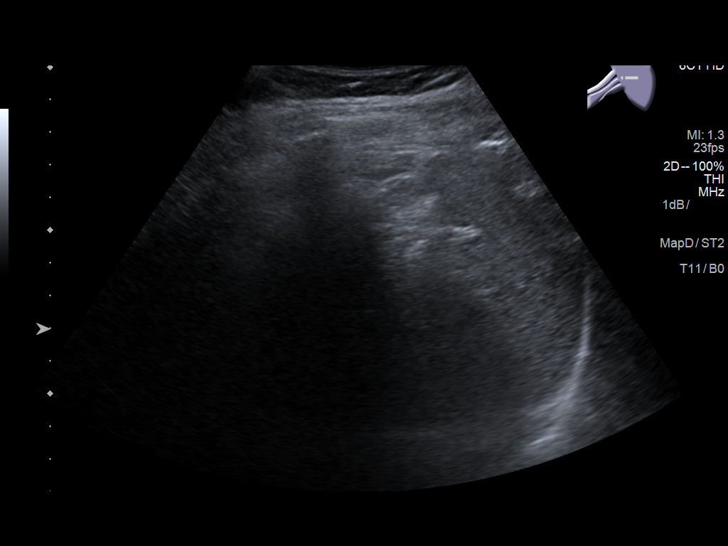
[im 12/12]
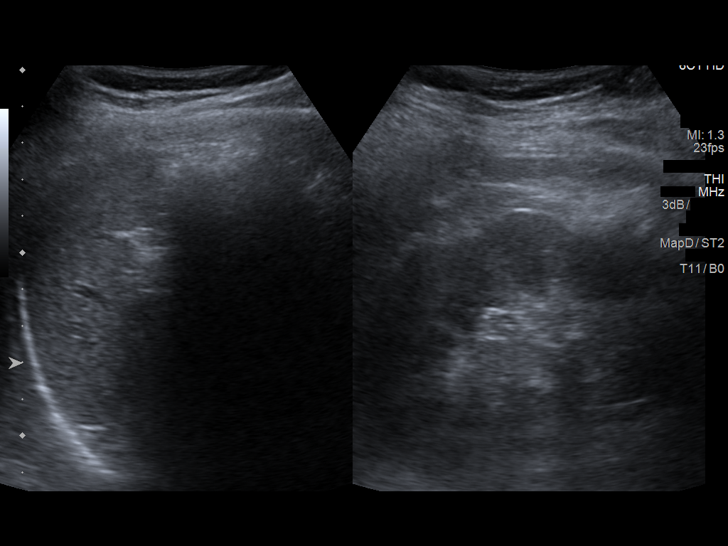

[12 of 12 positions shown; findings below may reference images not displayed]

FINDINGS: Spleen measures 4.1 cm with a volume of 53.4 cc. No focal splenic
abnormality identified. No perisplenic fluid collections noted.
IMPRESSION: Normal-appearing spleen. No evidence of splenomegaly or focal
splenic lesion.

## 2020-07-29 ENCOUNTER — Ambulatory Visit: Payer: 59 | Admitting: Urology

## 2020-08-17 ENCOUNTER — Ambulatory Visit: Payer: Self-pay

## 2020-08-17 ENCOUNTER — Telehealth (INDEPENDENT_AMBULATORY_CARE_PROVIDER_SITE_OTHER): Payer: 59 | Admitting: Family Medicine

## 2020-08-17 DIAGNOSIS — J0110 Acute frontal sinusitis, unspecified: Secondary | ICD-10-CM

## 2020-08-17 DIAGNOSIS — R42 Dizziness and giddiness: Secondary | ICD-10-CM

## 2020-08-17 DIAGNOSIS — H66002 Acute suppurative otitis media without spontaneous rupture of ear drum, left ear: Secondary | ICD-10-CM

## 2020-08-17 MED ORDER — FLUCONAZOLE 150 MG PO TABS: 150.0000 mg | ORAL_TABLET | Freq: Once | ORAL | 0 refills | Status: DC

## 2020-08-17 MED ORDER — AMOXICILLIN 500 MG PO CAPS: 1000.0000 mg | ORAL_CAPSULE | Freq: Two times a day (BID) | ORAL | 0 refills | Status: AC

## 2020-08-17 MED ORDER — MECLIZINE HCL 25 MG PO TABS: 25.0000 mg | ORAL_TABLET | Freq: Three times a day (TID) | ORAL | 1 refills | Status: AC | PRN

## 2020-08-17 NOTE — Telephone Encounter (Signed)
Pt. Reports she went to [facility] yesterday with left ear pain, sinus symptoms. Her BP there was 160/90, heart rate "over 100 and at one time 140." "They wanted me to go to the ED, but I couldn't go over there and sit." Today has vertigo. "I get vertigo when I have sinus issues." Pulse this morning is 100. No other symptoms. Spoke with Aggie Cosier in the practice and will schedule a virtual visit. Pt. Is concerned about her BP being high yesterday.  Reason for Disposition  Earache  Answer Assessment - Initial Assessment Questions 1. DESCRIPTION: "Describe your dizziness."     Vertigo 2. VERTIGO: "Do you feel like either you or the room is spinning or tilting?"      Yes 3. LIGHTHEADED: "Do you feel lightheaded?" (e.g., somewhat faint, woozy, weak upon standing)     Yes 4. SEVERITY: "How bad is it?"  "Can you walk?"   - MILD: Feels unsteady but walking normally.   - MODERATE: Feels very unsteady when walking, but not falling; interferes with normal activities (e.g., school, work) .   - SEVERE: Unable to walk without falling, or requires assistance to walk without falling.     Moderate 5. ONSET:  "When did the dizziness begin?"     Yesterday 6. AGGRAVATING FACTORS: "Does anything make it worse?" (e.g., standing, change in head position)     Standing 7. CAUSE: "What do you think is causing the dizziness?"     Unsure 8. RECURRENT SYMPTOM: "Have you had dizziness before?" If Yes, ask: "When was the last time?" "What happened that time?"     Yes 9. OTHER SYMPTOMS: "Do you have any other symptoms?" (e.g., headache, weakness, numbness, vomiting, earache)     Left ear pain, sinus symptoms 10. PREGNANCY: "Is there any chance you are pregnant?" "When was your last menstrual period?"       No  Protocols used: DIZZINESS - VERTIGO-A-AH

## 2020-08-17 NOTE — Progress Notes (Signed)
MyChart Video Visit    Virtual Visit via Video Note   This visit type was conducted due to national recommendations for restrictions regarding the COVID-19 Pandemic (e.g. social distancing) in an effort to limit this patient's exposure and mitigate transmission in our community. This patient is at least at moderate risk for complications without adequate follow up. This format is felt to be most appropriate for this patient at this time. Physical exam was limited by quality of the video and audio technology used for the visit.   Patient location: home Provider location: [facility]  I discussed the limitations of evaluation and management by telemedicine and the availability of in person appointments. The patient expressed understanding and agreed to proceed.  Patient: Natasha Hartman   DOB: 12-03-1970   49 y.o. Female  MRN: [id number] Visit Date: 08/17/2020  Today's healthcare provider: Mila Merry, MD   Chief Complaint  Patient presents with  . Ear Pain   Subjective    Otalgia  There is pain in the left ear. This is a new problem. Episode onset: 3 days ago. The problem has been gradually worsening. There has been no fever. Pertinent negatives include no abdominal pain, coughing, sore throat or vomiting. She has tried NSAIDs (heat application) for the symptoms. The treatment provided no relief.    She was seen at an urgent care yesterday with left ear pain, sinus symptoms. Her BP there was 160/90, heart rate "over 100 and at one time 140." "They wanted me to go to the ED, but I couldn't go over there and sit." Today has vertigo.She states  "I get vertigo when I have sinus issues." Pulse this morning was 100. No other symptoms. She is concerned about her blood pressure being high yesterday.    Medications: Outpatient Medications Prior to Visit  Medication Sig  . cetirizine (ZYRTEC) 10 MG tablet Take 10 mg by mouth daily.  Colleen Can FE 1/20 1-20 MG-MCG tablet TAKE ACTIVE PILLS ONLY,  NO WEEK OFF FOR MENSES  . [DISCONTINUED] budesonide-formoterol (SYMBICORT) 160-4.5 MCG/ACT inhaler Inhale 2 puffs into the lungs 2 (two) times daily for 14 days.  . [DISCONTINUED] fluconazole (DIFLUCAN) 100 MG tablet Take 1 tablet (100 mg total) by mouth daily. X 7 days (Patient not taking: Reported on 08/17/2020)  . [DISCONTINUED] ketorolac (TORADOL) 10 MG tablet Take 1 tablet (10 mg total) by mouth every 6 (six) hours as needed. (Patient not taking: Reported on 08/17/2020)  . [DISCONTINUED] ondansetron (ZOFRAN) 4 MG tablet Take 1 tablet (4 mg total) by mouth every 8 (eight) hours as needed for nausea or vomiting. (Patient not taking: Reported on 08/17/2020)  . [DISCONTINUED] tamsulosin (FLOMAX) 0.4 MG CAPS capsule Take 1 capsule (0.4 mg total) by mouth daily. (Patient not taking: Reported on 08/17/2020)  . [DISCONTINUED] UNABLE TO FIND COMPOUNDED MEDICATION  Boric Acid suppository 600mg   1 pv qhs x 7 days then qod x 1 week then twice weekly x 3 weeks (Patient not taking: Reported on 08/17/2020)   No facility-administered medications prior to visit.    Review of Systems  Constitutional: Negative for appetite change, chills, diaphoresis, fatigue and fever.  HENT: Positive for congestion (nasal congestion), ear pain (left ear), postnasal drip, sinus pressure (left side) and sinus pain (left side). Negative for sore throat.   Respiratory: Negative for cough, chest tightness and shortness of breath.   Cardiovascular: Negative for chest pain and palpitations.  Gastrointestinal: Positive for nausea. Negative for abdominal pain and vomiting.  Neurological: Positive  for dizziness. Negative for weakness.      Objective    There were no vitals taken for this visit.   Physical Exam  Awake, alert, oriented x 3. In no apparent distress    Assessment & Plan     1. Acute non-recurrent frontal sinusitis  - amoxicillin (AMOXIL) 500 MG capsule; Take 2 capsules (1,000 mg total) by mouth 2 (two)  times daily for 7 days.  Dispense: 28 capsule; Refill: 0  2. Non-recurrent acute suppurative otitis media of left ear without spontaneous rupture of tympanic membrane  - amoxicillin (AMOXIL) 500 MG capsule; Take 2 capsules (1,000 mg total) by mouth 2 (two) times daily for 7 days.  Dispense: 28 capsule; Refill: 0 - fluconazole (DIFLUCAN) 150 MG tablet; Take 1 tablet (150 mg total) by mouth once for 1 dose.  Dispense: 1 tablet; Refill: 0  3. Vertigo refill- meclizine (ANTIVERT) 25 MG tablet; Take 1 tablet (25 mg total) by mouth 3 (three) times daily as needed for dizziness.  Dispense: 30 tablet; Refill: 1   Call if symptoms change or if not rapidly improving.    BP was noted to be elevated since being sick. Recommend she get home BP monitoring and checke periodically after recovering from current illness.        I discussed the assessment and treatment plan with the patient. The patient was provided an opportunity to ask questions and all were answered. The patient agreed with the plan and demonstrated an understanding of the instructions.   The patient was advised to call back or seek an in-person evaluation if the symptoms worsen or if the condition fails to improve as anticipated.  I provided 10 minutes of non-face-to-face time during this encounter.  The entirety of the information documented in the History of Present Illness, Review of Systems and Physical Exam were personally obtained by me. Portions of this information were initially documented by the CMA and reviewed by me for thoroughness and accuracy.     Mila Merry, MD [facility] [phone] (phone) [phone] (fax)  [facility]

## 2020-08-20 ENCOUNTER — Other Ambulatory Visit: Payer: Self-pay | Admitting: Family Medicine

## 2020-08-20 DIAGNOSIS — H66002 Acute suppurative otitis media without spontaneous rupture of ear drum, left ear: Secondary | ICD-10-CM

## 2020-08-20 NOTE — Telephone Encounter (Signed)
Relation to pt: self  Call back number: [phone]  Pharmacy:  [id number] - [place], [place] - [address] Phone:  [phone]  Fax:  [phone]       Reason for call:  Patient requesting 1 diflucan tablet. Patient states she had to take 1 tab of diflucan yesterday given by PCP. Patient states she will complete antibiotics by Monday and she states the yeast infection will worsen and would like 1 more tablet,please advise

## 2020-08-21 MED ORDER — FLUCONAZOLE 150 MG PO TABS: 150.0000 mg | ORAL_TABLET | Freq: Once | ORAL | 0 refills | Status: AC

## 2020-08-21 NOTE — Telephone Encounter (Signed)
Please review. Ok to send in diflucan?

## 2020-08-24 ENCOUNTER — Telehealth: Payer: Self-pay

## 2020-08-24 NOTE — Telephone Encounter (Signed)
Patient called back wanted to know why no one called her back. Per patient she feel a little better after the antibiotics but can not hear from her right ear

## 2020-08-24 NOTE — Telephone Encounter (Signed)
Copied from CRM [id number]. Topic: General - Other >> Aug 24, 2020 10:40 AM Herby Abraham C wrote: Reason for CRM: pt called in for advise. Pt says that she was seen and prescribed a antibiotic for her ear. Pt says that she just took the last of medication and is still having some ear discomfort.   Pt would like to be advised further.

## 2020-08-25 ENCOUNTER — Ambulatory Visit: Payer: 59 | Admitting: Family Medicine

## 2020-08-25 ENCOUNTER — Other Ambulatory Visit: Payer: Self-pay

## 2020-08-25 ENCOUNTER — Encounter: Payer: Self-pay | Admitting: Family Medicine

## 2020-08-25 VITALS — BP 122/77 | HR 89 | Temp 98.7°F | Resp 16 | Wt 140.0 lb

## 2020-08-25 DIAGNOSIS — H6691 Otitis media, unspecified, right ear: Secondary | ICD-10-CM | POA: Diagnosis not present

## 2020-08-25 MED ORDER — FLUCONAZOLE 150 MG PO TABS: 150.0000 mg | ORAL_TABLET | Freq: Once | ORAL | 0 refills | Status: AC

## 2020-08-25 MED ORDER — CEFDINIR 300 MG PO CAPS: 600.0000 mg | ORAL_CAPSULE | Freq: Every day | ORAL | 0 refills | Status: AC

## 2020-08-25 NOTE — Progress Notes (Signed)
Established patient visit   Patient: Natasha Hartman   DOB: 02/24/1971   49 y.o. Female  MRN: [id number] Visit Date: 08/25/2020  Today's healthcare provider: Mila Merry, MD   Chief Complaint  Patient presents with  . Ear Pain   Subjective    Otalgia  There is pain in the right ear. This is a new problem. Episode onset: 11 days ago. There has been no fever. Pertinent negatives include no abdominal pain or vomiting. She has tried antibiotics for the symptoms. The treatment provided moderate relief.    Patient was seen via virtual video visit on 08/17/2020 for otitis media of left ear, vertigo and acute frontal sinusitis. Patient was treated with 7 day course of Amoxicillin 500mg  twice daily. She was also prescribed Meclizine to help with dizziness. Patient has completed all doses of the antibiotic. States that sinus pain and left ear pain has completely resolve, but is now having discomfort in her right ear since yesterday.      Medications: Outpatient Medications Prior to Visit  Medication Sig  . cetirizine (ZYRTEC) 10 MG tablet Take 10 mg by mouth daily.  FE 1/20 1-20 MG-MCG tablet TAKE ACTIVE PILLS ONLY, NO WEEK OFF FOR MENSES  . meclizine (ANTIVERT) 25 MG tablet Take 1 tablet (25 mg total) by mouth 3 (three) times daily as needed for dizziness. (Patient not taking: Reported on 08/25/2020)   No facility-administered medications prior to visit.    Review of Systems  Constitutional: Negative for appetite change, chills, fatigue and fever.  HENT: Positive for ear pain (right ear pressure).   Respiratory: Negative for chest tightness and shortness of breath.   Cardiovascular: Negative for chest pain and palpitations.  Gastrointestinal: Negative for abdominal pain, nausea and vomiting.  Neurological: Negative for dizziness and weakness.      Objective    BP 122/77 (BP Location: Left Arm, Patient Position: Sitting, Cuff Size: Normal)   Pulse 89   Temp 98.7  F (37.1 C) (Oral)   Resp 16   Wt 140 lb (63.5 kg)   BMI 25.61 kg/m    Physical Exam  General Appearance:     Well developed, well nourished female, alert, cooperative, in no acute distress  HENT:   left TM normal without fluid or infection, right TM red, dull, bulging, neck without nodes and sinuses nontender  Eyes:    PERRL, conjunctiva/corneas clear, EOM's intact       Lungs:     Clear to auscultation bilaterally, respirations unlabored  Heart:    Normal heart rate. Normal rhythm. No murmurs, rubs, or gallops.   Neurologic:   Awake, alert, oriented x 3. No apparent focal neurological           defect.         Assessment & Plan     1. Right otitis media, unspecified otitis media type Sinusitis and left OM sx resolved, but now infection on right - cefdinir (OMNICEF) 300 MG capsule; Take 2 capsules (600 mg total) by mouth daily for 7 days.  Dispense: 14 capsule; Refill: 0 - fluconazole (DIFLUCAN) 150 MG tablet; Take 1 tablet (150 mg total) by mouth once for 1 dose.  Dispense: 1 tablet; Refill: 0   Call if symptoms change or if not rapidly improving.          The entirety of the information documented in the History of Present Illness, Review of Systems and Physical Exam were personally obtained by me. Portions  of this information were initially documented by the CMA and reviewed by me for thoroughness and accuracy.      Lelon Huh, MD  [facility] [phone] (phone) [phone] (fax)  [place]

## 2020-08-25 NOTE — Telephone Encounter (Signed)
Called patient to let her know that she needs to be seen in the office and scheduled appt for 1:40 per Dr. [name].

## 2020-08-25 NOTE — Telephone Encounter (Signed)
Pt called to see what Dr. [name] thinks she should do/ does she need to be seen again or have more medication sent in / please advise

## 2020-08-25 NOTE — Telephone Encounter (Signed)
Need to see her in office so we can look in her ear canal. Can open up the 1:20 slot for her.

## 2020-09-07 ENCOUNTER — Ambulatory Visit: Payer: 59 | Admitting: Family Medicine

## 2020-09-07 ENCOUNTER — Other Ambulatory Visit: Payer: Self-pay

## 2020-09-07 ENCOUNTER — Encounter: Payer: Self-pay | Admitting: Family Medicine

## 2020-09-07 VITALS — BP 149/87 | HR 99 | Temp 98.4°F | Wt 134.0 lb

## 2020-09-07 DIAGNOSIS — H6981 Other specified disorders of Eustachian tube, right ear: Secondary | ICD-10-CM

## 2020-09-07 MED ORDER — FLUTICASONE PROPIONATE 50 MCG/ACT NA SUSP: 2.0000 | Freq: Every day | NASAL | 6 refills | Status: AC

## 2020-09-07 NOTE — Progress Notes (Signed)
Established patient visit   Patient: Natasha Hartman   DOB: 1971-03-31   49 y.o. Female  MRN: [id number] Visit Date: 09/07/2020  Today's healthcare provider: Shirlee Latch, MD   Chief Complaint  Patient presents with   Ear Pain   Subjective    HPI    Finished cefdinir for R AOM last week. Now having popping and pain in R ear. No fevers. +PND. Recent sinusitis treated with Amoxicillin also.  Patient Active Problem List   Diagnosis Date Noted   Acid reflux 12/22/2017   History of diabetes mellitus arising in pregnancy 12/22/2017   Decreased potassium in the blood 12/22/2017   Past Medical History:  Diagnosis Date   Kidney stone    Social History   Tobacco Use   Smoking status: Never Smoker   Smokeless tobacco: Never Used  Substance Use Topics   Alcohol use: Never   Drug use: Never   Allergies  Allergen Reactions   Hydrocodone-Acetaminophen Shortness Of Breath   Codeine     GI upset     Medications: Outpatient Medications Prior to Visit  Medication Sig   cetirizine (ZYRTEC) 10 MG tablet Take 10 mg by mouth daily.   JUNEL FE 1/20 1-20 MG-MCG tablet TAKE ACTIVE PILLS ONLY, NO WEEK OFF FOR MENSES   meclizine (ANTIVERT) 25 MG tablet Take 1 tablet (25 mg total) by mouth 3 (three) times daily as needed for dizziness. (Patient not taking: No sig reported)   No facility-administered medications prior to visit.    Review of Systems  Constitutional: Negative.   HENT: Positive for ear pain (right ear pain), hearing loss, postnasal drip and tinnitus. Negative for congestion, ear discharge, rhinorrhea, sinus pressure, sinus pain, sneezing and sore throat.   Respiratory: Negative.   Gastrointestinal: Negative.   Neurological: Negative for dizziness, light-headedness and headaches.      Objective    BP (!) 149/87 (BP Location: Left Arm, Patient Position: Sitting, Cuff Size: Normal)    Pulse 99    Temp 98.4 F (36.9 C) (Oral)    Wt 134 lb  (60.8 kg)    BMI 24.51 kg/m    Physical Exam Vitals reviewed.  Constitutional:      General: She is not in acute distress.    Appearance: Normal appearance. She is not diaphoretic.  HENT:     Head: Normocephalic and atraumatic.     Right Ear: Ear canal and external ear normal. No middle ear effusion. There is no impacted cerumen. No mastoid tenderness. Tympanic membrane is not erythematous.     Left Ear: Tympanic membrane, ear canal and external ear normal. There is no impacted cerumen.  Eyes:     General: No scleral icterus.    Conjunctiva/sclera: Conjunctivae normal.  Neurological:     Mental Status: She is alert.       No results found for any visits on 09/07/20.  Assessment & Plan     1. Eustachian tube dysfunction, right - reassured patient that no futher infection is present - no signs of AOM on exam - with symptoms that she describes, c/w Eustachian tube dysfunction - discussed symptomatic management with decongestant, antihistamine and flonase - discussed that could consider prednisone burst if pain is not improving - discussed natural course and return precautions  Return if symptoms worsen or fail to improve.      I, Shirlee Latch, MD, have reviewed all documentation for this visit. The documentation on 09/07/20 for the exam, diagnosis, procedures,  and orders are all accurate and complete.   Torii Royse, Marzella Schlein, MD, MPH [facility]

## 2020-09-07 NOTE — Patient Instructions (Signed)
Eustachian Tube Dysfunction ° °Eustachian tube dysfunction refers to a condition in which a blockage develops in the narrow passage that connects the middle ear to the back of the nose (eustachian tube). The eustachian tube regulates air pressure in the middle ear by letting air move between the ear and nose. It also helps to drain fluid from the middle ear space. °Eustachian tube dysfunction can affect one or both ears. When the eustachian tube does not function properly, air pressure, fluid, or both can build up in the middle ear. °What are the causes? °This condition occurs when the eustachian tube becomes blocked or cannot open normally. Common causes of this condition include: °· Ear infections. °· Colds and other infections that affect the nose, mouth, and throat (upper respiratory tract). °· Allergies. °· Irritation from cigarette smoke. °· Irritation from stomach acid coming up into the esophagus (gastroesophageal reflux). The esophagus is the tube that carries food from the mouth to the stomach. °· Sudden changes in air pressure, such as from descending in an airplane or scuba diving. °· Abnormal growths in the nose or throat, such as: °? Growths that line the nose (nasal polyps). °? Abnormal growth of cells (tumors). °? Enlarged tissue at the back of the throat (adenoids). °What increases the risk? °You are more likely to develop this condition if: °· You smoke. °· You are overweight. °· You are a child who has: °? Certain birth defects of the mouth, such as cleft palate. °? Large tonsils or adenoids. °What are the signs or symptoms? °Common symptoms of this condition include: °· A feeling of fullness in the ear. °· Ear pain. °· Clicking or popping noises in the ear. °· Ringing in the ear. °· Hearing loss. °· Loss of balance. °· Dizziness. °Symptoms may get worse when the air pressure around you changes, such as when you travel to an area of high elevation, fly on an airplane, or go scuba diving. °How is  this diagnosed? °This condition may be diagnosed based on: °· Your symptoms. °· A physical exam of your ears, nose, and throat. °· Tests, such as those that measure: °? The movement of your eardrum (tympanogram). °? Your hearing (audiometry). °How is this treated? °Treatment depends on the cause and severity of your condition. °· In mild cases, you may relieve your symptoms by moving air into your ears. This is called "popping the ears." °· In more severe cases, or if you have symptoms of fluid in your ears, treatment may include: °? Medicines to relieve congestion (decongestants). °? Medicines that treat allergies (antihistamines). °? Nasal sprays or ear drops that contain medicines that reduce swelling (steroids). °? A procedure to drain the fluid in your eardrum (myringotomy). In this procedure, a small tube is placed in the eardrum to: °§ Drain the fluid. °§ Restore the air in the middle ear space. °? A procedure to insert a balloon device through the nose to inflate the opening of the eustachian tube (balloon dilation). °Follow these instructions at home: °Lifestyle °· Do not do any of the following until your health care provider approves: °? Travel to high altitudes. °? Fly in airplanes. °? Work in a pressurized cabin or room. °? Scuba dive. °· Do not use any products that contain nicotine or tobacco, such as cigarettes and e-cigarettes. If you need help quitting, ask your health care provider. °· Keep your ears dry. Wear fitted earplugs during showering and bathing. Dry your ears completely after. °General instructions °· Take over-the-counter   and prescription medicines only as told by your health care provider. °· Use techniques to help pop your ears as recommended by your health care provider. These may include: °? Chewing gum. °? Yawning. °? Frequent, forceful swallowing. °? Closing your mouth, holding your nose closed, and gently blowing as if you are trying to blow air out of your nose. °· Keep all  follow-up visits as told by your health care provider. This is important. °Contact a health care provider if: °· Your symptoms do not go away after treatment. °· Your symptoms come back after treatment. °· You are unable to pop your ears. °· You have: °? A fever. °? Pain in your ear. °? Pain in your head or neck. °? Fluid draining from your ear. °· Your hearing suddenly changes. °· You become very dizzy. °· You lose your balance. °Summary °· Eustachian tube dysfunction refers to a condition in which a blockage develops in the eustachian tube. °· It can be caused by ear infections, allergies, inhaled irritants, or abnormal growths in the nose or throat. °· Symptoms include ear pain, hearing loss, or ringing in the ears. °· Mild cases are treated with maneuvers to unblock the ears, such as yawning or ear popping. °· Severe cases are treated with medicines. Surgery may also be done (rare). °This information is not intended to replace advice given to you by your health care provider. Make sure you discuss any questions you have with your health care provider. °Document Revised: 12/26/2017 Document Reviewed: 12/26/2017 °[employer] Patient Education © 2020 [employer] ° °

## 2020-09-15 ENCOUNTER — Telehealth: Payer: Self-pay

## 2020-09-15 NOTE — Telephone Encounter (Signed)
Copied from CRM [id number]. Topic: General - Other >> Sep 15, 2020  8:56 AM Gwenlyn Fudge wrote: Reason for CRM: Pt called stating that she is needing to have her medical records sent to [facility]. She states that she has an appt on 09/24/19 at 3pm and is needing them sent over before then. Please advise.

## 2020-09-16 NOTE — Telephone Encounter (Signed)
Can send last 3 office notes which were all ENT related.

## 2024-03-25 ENCOUNTER — Other Ambulatory Visit: Payer: Self-pay | Admitting: Otolaryngology

## 2024-03-25 DIAGNOSIS — R42 Dizziness and giddiness: Secondary | ICD-10-CM

## 2024-03-25 DIAGNOSIS — G501 Atypical facial pain: Secondary | ICD-10-CM

## 2024-03-25 DIAGNOSIS — H9201 Otalgia, right ear: Secondary | ICD-10-CM

## 2024-03-25 DIAGNOSIS — R112 Nausea with vomiting, unspecified: Secondary | ICD-10-CM

## 2024-04-01 ENCOUNTER — Encounter: Payer: Self-pay | Admitting: Otolaryngology

## 2024-04-11 ENCOUNTER — Ambulatory Visit
Admission: RE | Admit: 2024-04-11 | Discharge: 2024-04-11 | Disposition: A | Discharge status: Home / Self Care | Source: Ambulatory Visit | Attending: Otolaryngology | Admitting: Otolaryngology

## 2024-04-11 DIAGNOSIS — R42 Dizziness and giddiness: Secondary | ICD-10-CM

## 2024-04-11 DIAGNOSIS — G501 Atypical facial pain: Secondary | ICD-10-CM

## 2024-04-11 DIAGNOSIS — R112 Nausea with vomiting, unspecified: Secondary | ICD-10-CM

## 2024-04-11 DIAGNOSIS — H9201 Otalgia, right ear: Secondary | ICD-10-CM

## 2024-04-11 MED ORDER — GADOPICLENOL 0.5 MMOL/ML IV SOLN
7.5000 mL | Freq: Once | INTRAVENOUS | Status: AC | PRN
  Administered 2024-04-11: 7.5 mL via INTRAVENOUS
